# Patient Record
Sex: Male | Born: 1935 | Race: White | Hispanic: No | Marital: Married | Smoking: Current some day smoker
Health system: Southern US, Community
[De-identification: ages and names within clinical notes are randomized; demographics above are authoritative.]

## PROBLEM LIST (undated history)

## (undated) DIAGNOSIS — E785 Hyperlipidemia, unspecified: Secondary | ICD-10-CM

## (undated) DIAGNOSIS — C801 Malignant (primary) neoplasm, unspecified: Secondary | ICD-10-CM

## (undated) DIAGNOSIS — I2510 Atherosclerotic heart disease of native coronary artery without angina pectoris: Secondary | ICD-10-CM

## (undated) DIAGNOSIS — I739 Peripheral vascular disease, unspecified: Secondary | ICD-10-CM

## (undated) DIAGNOSIS — I10 Essential (primary) hypertension: Secondary | ICD-10-CM

## (undated) DIAGNOSIS — M359 Systemic involvement of connective tissue, unspecified: Secondary | ICD-10-CM

## (undated) DIAGNOSIS — I6529 Occlusion and stenosis of unspecified carotid artery: Secondary | ICD-10-CM

## (undated) DIAGNOSIS — J449 Chronic obstructive pulmonary disease, unspecified: Secondary | ICD-10-CM

## (undated) DIAGNOSIS — I639 Cerebral infarction, unspecified: Secondary | ICD-10-CM

## (undated) DIAGNOSIS — F0390 Unspecified dementia without behavioral disturbance: Secondary | ICD-10-CM

## (undated) HISTORY — PX: SKIN BIOPSY: SHX1

## (undated) HISTORY — PX: CORONARY ARTERY BYPASS GRAFT: SHX141

## (undated) HISTORY — PX: LUNG BIOPSY: SHX232

## (undated) HISTORY — PX: APPENDECTOMY: SHX54

---

## 2008-11-08 ENCOUNTER — Ambulatory Visit: Payer: Self-pay | Admitting: Orthopedic Surgery

## 2008-11-13 ENCOUNTER — Inpatient Hospital Stay: Payer: Self-pay | Admitting: Internal Medicine

## 2008-12-21 ENCOUNTER — Ambulatory Visit: Payer: Self-pay | Admitting: Urology

## 2009-10-17 ENCOUNTER — Ambulatory Visit: Payer: Self-pay | Admitting: Internal Medicine

## 2010-12-12 ENCOUNTER — Ambulatory Visit: Payer: Self-pay | Admitting: Internal Medicine

## 2011-04-24 ENCOUNTER — Ambulatory Visit: Payer: Self-pay

## 2011-12-11 ENCOUNTER — Ambulatory Visit: Payer: Self-pay | Admitting: Family Medicine

## 2011-12-12 ENCOUNTER — Inpatient Hospital Stay: Payer: Self-pay | Admitting: Internal Medicine

## 2011-12-12 LAB — CBC
HCT: 38.6 % — ABNORMAL LOW (ref 40.0–52.0)
HGB: 12.8 g/dL — ABNORMAL LOW (ref 13.0–18.0)
MCH: 32.5 pg (ref 26.0–34.0)
MCHC: 33.1 g/dL (ref 32.0–36.0)
RDW: 14.3 % (ref 11.5–14.5)

## 2011-12-12 LAB — URINALYSIS, COMPLETE
Bacteria: NONE SEEN
Bilirubin,UR: NEGATIVE
Glucose,UR: NEGATIVE mg/dL (ref 0–75)
Ketone: NEGATIVE
Nitrite: NEGATIVE
Ph: 6 (ref 4.5–8.0)
Protein: NEGATIVE
WBC UR: <1 /HPF (ref 0–5)

## 2011-12-12 LAB — COMPREHENSIVE METABOLIC PANEL
Albumin: 2.7 g/dL — ABNORMAL LOW (ref 3.4–5.0)
Alkaline Phosphatase: 103 U/L (ref 50–136)
Calcium, Total: 8.4 mg/dL — ABNORMAL LOW (ref 8.5–10.1)
Chloride: 101 mmol/L (ref 98–107)
Creatinine: 1.1 mg/dL (ref 0.60–1.30)
EGFR (Non-African Amer.): >60
Glucose: 90 mg/dL (ref 65–99)
Potassium: 4.3 mmol/L (ref 3.5–5.1)
Sodium: 137 mmol/L (ref 136–145)
Total Protein: 6.6 g/dL (ref 6.4–8.2)

## 2011-12-13 LAB — CBC WITH DIFFERENTIAL/PLATELET
Basophil #: 0 10*3/uL (ref 0.0–0.1)
Basophil %: 0.3 %
Eosinophil #: 0 10*3/uL (ref 0.0–0.7)
Eosinophil %: 0.4 %
HCT: 36.6 % — ABNORMAL LOW (ref 40.0–52.0)
MCH: 32.7 pg (ref 26.0–34.0)
MCHC: 33.2 g/dL (ref 32.0–36.0)
MCV: 98 fL (ref 80–100)
Monocyte #: 0.6 x10 3/mm (ref 0.2–1.0)
Neutrophil #: 10.6 10*3/uL — ABNORMAL HIGH (ref 1.4–6.5)
Neutrophil %: 84.4 %
RBC: 3.72 10*6/uL — ABNORMAL LOW (ref 4.40–5.90)
WBC: 12.5 10*3/uL — ABNORMAL HIGH (ref 3.8–10.6)

## 2011-12-13 LAB — BASIC METABOLIC PANEL
Calcium, Total: 8.2 mg/dL — ABNORMAL LOW (ref 8.5–10.1)
Chloride: 105 mmol/L (ref 98–107)
Creatinine: 0.99 mg/dL (ref 0.60–1.30)
EGFR (African American): >60
EGFR (Non-African Amer.): >60
Potassium: 4.2 mmol/L (ref 3.5–5.1)

## 2011-12-14 LAB — BASIC METABOLIC PANEL
Anion Gap: 10 (ref 7–16)
Calcium, Total: 8.3 mg/dL — ABNORMAL LOW (ref 8.5–10.1)
Creatinine: 0.93 mg/dL (ref 0.60–1.30)
EGFR (Non-African Amer.): >60
Osmolality: 281 (ref 275–301)
Potassium: 4.2 mmol/L (ref 3.5–5.1)
Sodium: 138 mmol/L (ref 136–145)

## 2011-12-14 LAB — CBC WITH DIFFERENTIAL/PLATELET
Basophil %: 0.1 %
Eosinophil #: 0 10*3/uL (ref 0.0–0.7)
Eosinophil %: 0.1 %
HCT: 35.7 % — ABNORMAL LOW (ref 40.0–52.0)
Lymphocyte %: 3.1 %
MCH: 32.3 pg (ref 26.0–34.0)
MCHC: 33.2 g/dL (ref 32.0–36.0)
MCV: 97 fL (ref 80–100)
Monocyte #: 0.1 x10 3/mm — ABNORMAL LOW (ref 0.2–1.0)
Neutrophil %: 95.8 %
RDW: 14.3 % (ref 11.5–14.5)
WBC: 11.9 10*3/uL — ABNORMAL HIGH (ref 3.8–10.6)

## 2011-12-16 LAB — CBC WITH DIFFERENTIAL/PLATELET
Basophil #: 0 10*3/uL (ref 0.0–0.1)
Basophil %: 0 %
HCT: 36.3 % — ABNORMAL LOW (ref 40.0–52.0)
HGB: 12.1 g/dL — ABNORMAL LOW (ref 13.0–18.0)
Lymphocyte %: 8.4 %
Neutrophil #: 11.5 10*3/uL — ABNORMAL HIGH (ref 1.4–6.5)
Platelet: 174 10*3/uL (ref 150–440)
RDW: 14.4 % (ref 11.5–14.5)
WBC: 13.3 10*3/uL — ABNORMAL HIGH (ref 3.8–10.6)

## 2011-12-16 LAB — BASIC METABOLIC PANEL
BUN: 25 mg/dL — ABNORMAL HIGH (ref 7–18)
Calcium, Total: 8.1 mg/dL — ABNORMAL LOW (ref 8.5–10.1)
Chloride: 104 mmol/L (ref 98–107)
Creatinine: 1.08 mg/dL (ref 0.60–1.30)
EGFR (African American): >60
EGFR (Non-African Amer.): >60
Glucose: 74 mg/dL (ref 65–99)
Osmolality: 279 (ref 275–301)
Sodium: 138 mmol/L (ref 136–145)

## 2011-12-18 LAB — CULTURE, BLOOD (SINGLE)

## 2012-01-04 ENCOUNTER — Ambulatory Visit: Payer: Self-pay | Admitting: Internal Medicine

## 2012-01-10 ENCOUNTER — Ambulatory Visit: Payer: Self-pay | Admitting: Internal Medicine

## 2012-05-20 ENCOUNTER — Ambulatory Visit: Payer: Self-pay | Admitting: Neurology

## 2012-06-30 ENCOUNTER — Ambulatory Visit: Payer: Self-pay | Admitting: Neurology

## 2013-04-28 ENCOUNTER — Emergency Department: Payer: Self-pay | Admitting: Emergency Medicine

## 2013-04-28 LAB — CBC
MCHC: 36 g/dL (ref 32.0–36.0)
Platelet: 164 10*3/uL (ref 150–440)
RBC: 3.81 10*6/uL — ABNORMAL LOW (ref 4.40–5.90)

## 2013-04-28 LAB — PROTIME-INR: Prothrombin Time: 14.1 secs (ref 11.5–14.7)

## 2013-09-09 ENCOUNTER — Ambulatory Visit: Payer: Self-pay | Admitting: Unknown Physician Specialty

## 2013-09-24 ENCOUNTER — Inpatient Hospital Stay: Payer: Self-pay | Admitting: Internal Medicine

## 2013-09-24 LAB — CBC
HCT: 41.8 % (ref 40.0–52.0)
HGB: 14.3 g/dL (ref 13.0–18.0)
MCH: 33.8 pg (ref 26.0–34.0)
MCHC: 34.3 g/dL (ref 32.0–36.0)
MCV: 99 fL (ref 80–100)
Platelet: 117 10*3/uL — ABNORMAL LOW (ref 150–440)
RBC: 4.24 10*6/uL — ABNORMAL LOW (ref 4.40–5.90)
RDW: 14.3 % (ref 11.5–14.5)
WBC: 21.1 10*3/uL — AB (ref 3.8–10.6)

## 2013-09-24 LAB — URINALYSIS, COMPLETE
Bacteria: NONE SEEN
Bilirubin,UR: NEGATIVE
GLUCOSE, UR: NEGATIVE mg/dL (ref 0–75)
Hyaline Cast: 3
Ketone: NEGATIVE
LEUKOCYTE ESTERASE: NEGATIVE
NITRITE: NEGATIVE
PROTEIN: NEGATIVE
Ph: 5 (ref 4.5–8.0)
RBC,UR: 1 /HPF (ref 0–5)
SQUAMOUS EPITHELIAL: NONE SEEN
Specific Gravity: 1.019 (ref 1.003–1.030)
WBC UR: 1 /HPF (ref 0–5)

## 2013-09-24 LAB — COMPREHENSIVE METABOLIC PANEL
ALBUMIN: 2.7 g/dL — AB (ref 3.4–5.0)
ALT: 22 U/L (ref 12–78)
AST: 24 U/L (ref 15–37)
Alkaline Phosphatase: 58 U/L
Anion Gap: 6 — ABNORMAL LOW (ref 7–16)
BUN: 18 mg/dL (ref 7–18)
Bilirubin,Total: 1 mg/dL (ref 0.2–1.0)
CHLORIDE: 107 mmol/L (ref 98–107)
Calcium, Total: 7.1 mg/dL — ABNORMAL LOW (ref 8.5–10.1)
Co2: 23 mmol/L (ref 21–32)
Creatinine: 1.04 mg/dL (ref 0.60–1.30)
EGFR (African American): >60
Glucose: 87 mg/dL (ref 65–99)
OSMOLALITY: 273 (ref 275–301)
Potassium: 3.7 mmol/L (ref 3.5–5.1)
SODIUM: 136 mmol/L (ref 136–145)
Total Protein: 5.4 g/dL — ABNORMAL LOW (ref 6.4–8.2)

## 2013-09-24 LAB — TROPONIN I
Troponin-I: <0.02 ng/mL
Troponin-I: <0.02 ng/mL

## 2013-09-25 LAB — CBC WITH DIFFERENTIAL/PLATELET
BASOS PCT: 0 %
Basophil #: 0 10*3/uL (ref 0.0–0.1)
EOS ABS: 0 10*3/uL (ref 0.0–0.7)
Eosinophil %: 0 %
HCT: 36.7 % — AB (ref 40.0–52.0)
HGB: 12.6 g/dL — AB (ref 13.0–18.0)
LYMPHS PCT: 2.9 %
Lymphocyte #: 0.4 10*3/uL — ABNORMAL LOW (ref 1.0–3.6)
MCH: 33.7 pg (ref 26.0–34.0)
MCHC: 34.2 g/dL (ref 32.0–36.0)
MCV: 99 fL (ref 80–100)
MONO ABS: 0.2 x10 3/mm (ref 0.2–1.0)
Monocyte %: 1.7 %
NEUTROS ABS: 13.3 10*3/uL — AB (ref 1.4–6.5)
Neutrophil %: 95.4 %
Platelet: 95 10*3/uL — ABNORMAL LOW (ref 150–440)
RBC: 3.72 10*6/uL — AB (ref 4.40–5.90)
RDW: 14.4 % (ref 11.5–14.5)
WBC: 14 10*3/uL — ABNORMAL HIGH (ref 3.8–10.6)

## 2013-09-25 LAB — BASIC METABOLIC PANEL
Anion Gap: 8 (ref 7–16)
BUN: 19 mg/dL — ABNORMAL HIGH (ref 7–18)
CALCIUM: 8.4 mg/dL — AB (ref 8.5–10.1)
CO2: 25 mmol/L (ref 21–32)
Chloride: 101 mmol/L (ref 98–107)
Creatinine: 1.19 mg/dL (ref 0.60–1.30)
EGFR (African American): >60
GFR CALC NON AF AMER: 57 — AB
Glucose: 135 mg/dL — ABNORMAL HIGH (ref 65–99)
Osmolality: 273 (ref 275–301)
Potassium: 4.1 mmol/L (ref 3.5–5.1)
Sodium: 134 mmol/L — ABNORMAL LOW (ref 136–145)

## 2013-09-29 LAB — CULTURE, BLOOD (SINGLE)

## 2014-10-16 NOTE — H&P (Signed)
PATIENT NAME:  Charles, Gallagher MR#:  [id number] DATE OF BIRTH:  1929/01/25  DATE OF ADMISSION:  09/24/2013  PRIMARY CARE PHYSICIAN: Emily Filbert, MD.  REFERRING EMERGENCY ROOM PHYSICIAN:  Dr. Lavonia Drafts.   CHIEF COMPLAINT: Shortness of breath.   HISTORY OF PRESENT ILLNESS: An 81 year old male with past medical history of coronary artery disease, COPD, active smoker who lives a reasonably active life, but has chronic spinal stenosis and some back pain. He is able to walk with a walker with some imbalance issues and has been falling down on and off. For the last 1 to 2 weeks, he is feeling short of breath. He went to primary care physician, was given prednisone and antibiotics, but it helped a little bit, but not much. And so because of this added shortness of breath on his imbalance and back pain issue, he had also fallen down. Finally, family decided to bring him to the hospital. In the Emergency Room as per the ER physician, on room air on resting, his oxygen saturation was 89. But when he made him stand up and walk, saturation dropped to 85 and so he is given an admission. He received nebulizer treatment and steroid in the Emergency Room, but not much help. Denies any fever, cough or sputum.  REVIEW OF SYSTEMS:  CONSTITUTIONAL: Negative for fever, fatigue, weakness, pain or weight loss.  EYES: No blurring, double vision, discharge or redness.  EARS, NOSE, THROAT: No tinnitus, ear pain or hearing loss.  RESPIRATORY: No cough, wheezing, hemoptysis or shortness of breath.  CARDIOVASCULAR: No chest pain, orthopnea, edema, arrhythmia or palpitations.  GASTROINTESTINAL: No nausea, vomiting, diarrhea, abdominal pain.  GENITOURINARY: No dysuria, hematuria or increased frequency.  ENDOCRINE: No heat or cold intolerance.  SKIN: No rashes. LEGS: No edema.  NEUROLOGICAL: No numbness, weakness, tremor or vertigo, but has somewhat imbalance while walking because of his back pain.  PSYCHIATRIC: No  anxiety, insomnia, bipolar disorder.   PAST MEDICAL HISTORY: 1.  History of coronary artery disease and CABG 20 years ago.  2.  Dementia.  3.  Spinal stenosis causing difficulty in walking and somewhat pain in the lower back.  4.  Coronary arteries are blocked up to 70% and he is following up with Dr. Manuella Ghazi in clinic for that frequently. 5.  COPD, current smoker.   PAST SURGICAL HISTORY: 1.  CABG.  2.  Lung biopsy.  3.  Appendectomy.   FAMILY HISTORY: Father died of gangrene. Mother died of cerebrovascular accident.   SOCIAL HISTORY: He lives with family. He walks with a cane. He is a smoker, 1 pack at least every day since teenage. Denies drinking alcohol or doing any drugs. Not on home oxygen yet.  MEDICATIONS: At home: 1.  Vitamin B complex 100 mg oral once a day.  2.  Simvastatin 80 mg once a day.  3.  Sertraline 50 mg oral once a day.  4.  Plavix 75 mg once a day. 5.  Galantamine 8 mg oral tablet 2 times a day.  6.  Cefuroxime 250 mg oral tablet 2 times a day.  7.  Aspirin 81 mg once a day.  PHYSICAL EXAMINATION: VITAL SIGNS:  In ER, temperature 98.6, pulse 67, respirations 28 on presentation, currently 18. Blood pressure 191/43 on presentation, currently it is 113/60 and oxygen saturation is in 90s at room air.  GENERAL: The patient is fully alert and oriented to time, place and person. Has some hearing issues.  HEENT: Head and neck atraumatic. Conjunctivae  pink. Oral mucosa moist.  NECK: Supple. No JVD.  RESPIRATORY: Bilateral equal air entry, somewhat wheezing present.  CARDIOVASCULAR: S1, S2 present, regular. No murmur.  ABDOMEN: Soft, nontender. Bowel sounds present.  No organomegaly.  SKIN: No rashes. LEGS:  No edema.  NEUROLOGICAL: Power 5/5. Moves all 4 limbs. Follows commands. JOINTS: No swelling or tenderness.  PSYCHIATRIC: Does not appear any acute psychiatric illness at this time.  IMPORTANT LABORATORY RESULTS: Chest x-ray, PA and lateral: No acute process.  Previous CABG, chronic pulmonary fibrosis.  Glucose 87, creatinine 1.04. Sodium 136, potassium 3.7, chloride 107, CO2 of 23. Calcium is 7.1. Total protein 5.4. Albumin is 2.7, bilirubin 1, alkaline phosphate 58, SGOT 24 and SGPT 22. Troponin less than 0.02. WBC 21.1, hemoglobin 14.3, platelet count 117.   ASSESSMENT AND PLAN: An 81 year old male with past medical history of coronary artery disease, smoker and chronic obstructive pulmonary disease came to the Emergency Room with worsening shortness of breath and found having hypoxia on room air.  1.  Chronic obstructive pulmonary disease exacerbation and acute respiratory failure. The patient is a chronic smoker and has some underlying lung fibrosis.  oral steroid and antibiotics by primary care physician, still did not help. And as per Emergency Room physician, his oxygen saturation is dropping up to 85 on attempt to walk. We will give him IV steroid. We will start him on Spiriva and DuoNeb, and we will also give antibiotic. His white cell count is high. He may need to follow up with Dr. Raul Del in office after discharge.  2.  Generalized weakness, chronic back pain and falls. The patient has spinal stenosis which was causing him chronic back pain and somewhat imbalance but now more weakness. He has been falling down, so we will call physical therapy consult for further management of this issue.  3.  History of coronary artery disease status post coronary artery bypass graft. We will continue his aspirin and statins.  4.  Carotid artery atherosclerosis. He has blockages up to 70%. As per him, he is on aspirin and statin. We will continue same.  5.  Smoking. He is an active smoker. Cessation counseling is done for 4 minutes and offered a nicotine patch or inhalers. Currently, he does not want that.  CODE STATUS: Full code.  TOTAL TIME SPENT ON THIS ADMISSION:  50 minutes.    ____________________________ Ceasar Lund Anselm Jungling,  MD vgv:ce D: 09/24/2013 14:24:59 ET T: 09/24/2013 15:17:39 ET JOB#: [id number]  cc: Ceasar Lund. Anselm Jungling, MD, <Dictator> [name], MD [facility] MD ELECTRONICALLY SIGNED 09/28/2013 22:00

## 2014-10-16 NOTE — Discharge Summary (Signed)
PATIENT NAME:  Charles Gallagher, Charles Gallagher MR#:  [id number] DATE OF BIRTH:  01/07/1929  DATE OF ADMISSION:  09/24/2013 DATE OF DISCHARGE:  09/26/2013   DISCHARGE DIAGNOSES:  1. Right lower lobe aspiration pneumonia.  2. Lumbar spinal stenosis with weakness and falls.  3. Dementia, mild to moderate.  4. Coronary artery disease.  5. Chronic obstructive pulmonary disease.   DISCHARGE MEDICATIONS:  1. Simvastatin 80 mg at bedtime.  2. Augmentin 500 mg b.i.d. for 1 week.  3. Azithromycin 250 mg daily for 5 days.  4. Sertraline 50 mg daily. 5. Plavix 75 mg daily.  6. Aspirin 81 mg daily.  7. Galantamine 8 mg b.i.d. 8. Prednisone 20 mg daily for 10 days.  9. Tessalon Perles 100 mg t.i.d. p.r.n. cough.   REASON FOR ADMISSION: An 81 year old male presents with falls, coughing after eating and dyspnea with fever. Please see H and P for HPI, past medical history and physical exam.   HOSPITAL COURSE: The patient was admitted, was hypoxic, clinical exam consistent with aspiration pneumonia in the right lower lung fields. MBSS did show aspiration of thins and some solids. The family was instructed as far as aspiration issues. He saw physical therapy, and with his lower extremity weakness, will be getting home health physical therapy. His antibiotic was switched to p.o. Augmentin, which will cover anaerobes and aspiration better than the Ceftin did. He will be on a prednisone taper. He will follow up with Dr. Sabra Heck on Tuesday. Overall prognosis is guarded.   ____________________________ [name], MD mfm:lb D: 09/26/2013 10:34:08 ET T: 09/26/2013 11:47:12 ET JOB#: [id number]  cc: [name], MD, <Dictator> MARK Roselee Culver MD ELECTRONICALLY SIGNED 09/27/2013 9:56

## 2014-10-17 NOTE — Discharge Summary (Signed)
PATIENT NAME:  Charles Gallagher, Charles Gallagher MR#:  [id number] DATE OF BIRTH:  April 16, 1929  DATE OF ADMISSION:  12/12/2011 DATE OF DISCHARGE:  12/18/2011   DISCHARGE DIAGNOSES:  1. Left lower lobe pneumonia.  2. Arteriosclerotic cardiovascular disease.  3. Hyperlipidemia.  4. Oral candidiasis.  DISCHARGE MEDICATIONS:  1. Aspirin 81 mg daily.  2. Simvastatin 80 mg at bedtime.  3. Plavix 75 mg daily.  4. Prednisone taper.  5. Albuterol inhaler 1 puff q.i.d.  6. Ciprofloxacin 250 mg b.i.d. for one week.    REASON FOR ADMISSION: The patient is an 81 year old male who presented with left lower lobe pneumonia and hypoxemia. Please see history and physical for history of present illness, past medical history, and physical exam.   HOSPITAL COURSE: The patient was admitted and treated with IV Rocephin and azithromycin. His candidiasis was treated with nystatin. He was given high dose IV steroids. His lungs improved dramatically over the hospital course. Chest x-ray showed improvement. Pulse oximetry off oxygen was 93 to 99%. He was up and around more stable, eating, doing well, almost back to baseline. I will follow-up with him in one week.   OVERALL PROGNOSIS: Good.   ____________________________ [name], MD mfm:drc D: 12/18/2011 07:32:55 ET T: 12/18/2011 07:54:21 ET JOB#: [id number]  cc: [name], MD, <Dictator> Damione Robideau Roselee Culver MD ELECTRONICALLY SIGNED 12/22/2011 9:18

## 2014-10-17 NOTE — H&P (Signed)
PATIENT NAME:  Charles Gallagher, Charles Gallagher MR#:  [id number] DATE OF BIRTH:  07/03/1928  DATE OF ADMISSION:  12/12/2011  PRIMARY CARE PHYSICIAN: Dr. Sabra Heck   REFERRING PHYSICIAN: Dr. Roxine Caddy   CHIEF COMPLAINT: Dizziness.   HISTORY OF PRESENT ILLNESS: The patient is an 81 year old Caucasian male with a history of COPD, CAD, TIA, and right carotid stenosis who presented to the ED with dizziness and possible syncope. Apparently the patient was diagnosed with pneumonia one week ago. He has been treated with Levaquin for one week but the patient still has a cough with sputum. He feels dizzy sometimes. This morning his wife heard some noise and found he was on the floor but she said the patient was awake, alert, didn't notice if he had some syncope or loss of consciousness. The patient could not remember what happened. He only complains of dizziness but denies any seizure, syncope, loss of consciousness, or incontinence. According to the patient's daughter, the patient has been treated with Levaquin but he took Levaquin 1000 mg by mistake for about three days. In addition, the patient is still smoking 1 pack a day. The patient denies any fever or chills. No headache. No chest pain, palpitation, orthopnea, or nocturnal dyspnea. No abdominal pain, nausea, vomiting, or diarrhea. No dysuria, hematuria. The patient was noted to have orthostatic hypotension. Blood pressure in lying position was 106/57 and decreased to 72/45 in standing position and now I ordered normal saline bolus.   SOCIAL HISTORY: The patient has been smoking 1 pack a day since teenager. Denies any alcohol drinking or illicit drugs.   PAST SURGICAL HISTORY:  1. CABG. 2. Lung biopsy.  3. Appendectomy.   FAMILY HISTORY: Father died of gangrene. Mother died of CVA.   ALLERGIES: None.   MEDICATIONS:  1. Aspirin 81 mg p.o. daily.  2. Levaquin 500 mg p.o. daily.  3. Plavix 75 mg p.o. daily.  4. Prednisone 10 mg p.o. daily. Last dose is today.    5. Zocor 80 mg p.o. at bedtime.  6. Vitamin B Complex 100 1 tablet p.o. daily.   REVIEW OF SYSTEMS: CONSTITUTIONAL: The patient denies any fever or chills. No headache but has dizziness and weakness. EYES: No double vision or blurred vision. ENT: No epistaxis, postnasal drip, dysphagia, or slurred speech. CARDIOVASCULAR: No chest pain, palpitation, orthopnea, or nocturnal dyspnea. No leg edema. PULMONARY: Positive for cough with sputum. Has mild shortness of breath sometimes but denies hemoptysis or wheezing. GI: No abdominal pain, nausea, vomiting, or diarrhea. No melena or bloody stool. GU: No dysuria, hematuria, or incontinence. SKIN: No rash or jaundice. HEMATOLOGY: No easy bruising, bleeding. SKIN: No rash or jaundice. NEUROLOGY: No syncope, loss of consciousness or seizure.   PHYSICAL EXAMINATION:   VITALS: Temperature 98, blood pressure 100/57. As mentioned above, blood pressure decreased to 72/45 on standing position. Oxygen saturation 93% on room air.   GENERAL: The patient is alert, awake, oriented in no acute distress.   HEENT: Pupils are round, equal, reactive to light and accommodation. Mild dry oral mucosa. Clear oropharynx.   NECK: Supple. No JVD or carotid bruits. No adenopathy. No thyromegaly.   CARDIOVASCULAR: S1, S2 regular rate and rhythm. No murmurs or gallops.   PULMONARY: Bilateral air entry. No wheezing but has mild crackles in bilateral base.   ABDOMEN: Soft. No distention. No tenderness. No organomegaly. Bowel sounds present.   EXTREMITIES: No edema, clubbing, or cyanosis. No calf tenderness. Strong bilateral pedal pulses.   SKIN: No rash or jaundice.  NEUROLOGY: The patient is alert, awake, oriented in no acute distress. No focal deficit. Power 5/5. Sensation intact. Deep tendon reflexes 2+.   LABORATORY DATA: ABG shows pH 7.44, pCO2 37, pO2 55 with FiO2 21%.   CAT scan of head no acute intracranial process.   Chest x-ray showed improvement in previously  noted left lower lobe infiltrate. No new infiltrates.   WBC 12.6, hemoglobin 12.8, platelets 226, glucose 90, BUN 26, creatinine 1.1. Electrolytes are normal. Troponin less than 0.02.   EKG showed normal sinus rhythm with right bundle branch block at 63 beats per minute.   IMPRESSION:  1. Dizziness.  2. Orthostatic hypotension.  3. Pneumonia.  4. Hypoxia.  5. Leukocytosis.  6. Dehydration.  7. Coronary artery disease.  8. Chronic obstructive pulmonary disease.  9. Tobacco use disorder.   PLAN OF TREATMENT:  1. The patient will be admitted to tele floor.  2. Continue O2 by nasal cannula. 3. Discontinue Levaquin. 4. Start Zithromax and Rocephin.  5. Will give DuoNeb. 6. Follow-up CBC.  7. For dizziness and orthostatic hypotension and dehydration, we will give normal saline bolus 500 mL and then change to 100 mL/h if blood pressure is stable. The patient needs closely monitored vital signs and follow-up BMP.  8. For coronary artery disease, will continue aspirin, Plavix, and statin.  9. We will get physical therapy evaluation.  10. For tobacco use, I counseled the patient for smoking cessation and we will give nicotine patch.  Discussed the patient's situation and plan of treatment with the patient, the patient's wife, daughter and son.   TIME SPENT: About 65 minutes.   ____________________________ Demetrios Loll, MD qc:drc D: 12/12/2011 11:20:12 ET T: 12/12/2011 11:32:25 ET JOB#: [id number]  cc: Demetrios Loll, MD, <Dictator> Demetrios Loll MD ELECTRONICALLY SIGNED 12/12/2011 19:24

## 2014-11-07 ENCOUNTER — Other Ambulatory Visit: Payer: Self-pay

## 2014-11-07 ENCOUNTER — Emergency Department
Admission: EM | Admit: 2014-11-07 | Discharge: 2014-11-07 | Disposition: A | Discharge status: Home / Self Care | Payer: Medicare Other | Attending: Emergency Medicine | Admitting: Emergency Medicine

## 2014-11-07 ENCOUNTER — Emergency Department: Payer: Medicare Other

## 2014-11-07 ENCOUNTER — Encounter: Payer: Self-pay | Admitting: Emergency Medicine

## 2014-11-07 DIAGNOSIS — Z79899 Other long term (current) drug therapy: Secondary | ICD-10-CM | POA: Diagnosis not present

## 2014-11-07 DIAGNOSIS — Z7902 Long term (current) use of antithrombotics/antiplatelets: Secondary | ICD-10-CM | POA: Insufficient documentation

## 2014-11-07 DIAGNOSIS — R1032 Left lower quadrant pain: Secondary | ICD-10-CM

## 2014-11-07 DIAGNOSIS — Z720 Tobacco use: Secondary | ICD-10-CM | POA: Diagnosis not present

## 2014-11-07 DIAGNOSIS — N200 Calculus of kidney: Secondary | ICD-10-CM | POA: Diagnosis not present

## 2014-11-07 DIAGNOSIS — R52 Pain, unspecified: Secondary | ICD-10-CM

## 2014-11-07 HISTORY — DX: Unspecified dementia, unspecified severity, without behavioral disturbance, psychotic disturbance, mood disturbance, and anxiety: F03.90

## 2014-11-07 HISTORY — DX: Chronic obstructive pulmonary disease, unspecified: J44.9

## 2014-11-07 HISTORY — DX: Systemic involvement of connective tissue, unspecified: M35.9

## 2014-11-07 HISTORY — DX: Malignant (primary) neoplasm, unspecified: C80.1

## 2014-11-07 HISTORY — DX: Cerebral infarction, unspecified: I63.9

## 2014-11-07 LAB — COMPREHENSIVE METABOLIC PANEL
ALT: 30 U/L (ref 17–63)
AST: 41 U/L (ref 15–41)
Albumin: 4.6 g/dL (ref 3.5–5.0)
Alkaline Phosphatase: 63 U/L (ref 38–126)
Anion gap: 11 (ref 5–15)
BILIRUBIN TOTAL: 0.8 mg/dL (ref 0.3–1.2)
BUN: 20 mg/dL (ref 6–20)
CALCIUM: 9.5 mg/dL (ref 8.9–10.3)
CO2: 25 mmol/L (ref 22–32)
Chloride: 104 mmol/L (ref 101–111)
Creatinine, Ser: 1.29 mg/dL — ABNORMAL HIGH (ref 0.61–1.24)
GFR calc Af Amer: 57 mL/min — ABNORMAL LOW (ref >60)
GFR, EST NON AFRICAN AMERICAN: 50 mL/min — AB (ref >60)
GLUCOSE: 132 mg/dL — AB (ref 65–99)
Potassium: 4.8 mmol/L (ref 3.5–5.1)
Sodium: 140 mmol/L (ref 135–145)
TOTAL PROTEIN: 7.5 g/dL (ref 6.5–8.1)

## 2014-11-07 LAB — URINALYSIS COMPLETE WITH MICROSCOPIC (ARMC ONLY)
BILIRUBIN URINE: NEGATIVE
Bacteria, UA: NONE SEEN
Glucose, UA: NEGATIVE mg/dL
Ketones, ur: NEGATIVE mg/dL
Leukocytes, UA: NEGATIVE
Nitrite: NEGATIVE
Protein, ur: 30 mg/dL — AB
SPECIFIC GRAVITY, URINE: 1.018 (ref 1.005–1.030)
pH: 5 (ref 5.0–8.0)

## 2014-11-07 LAB — CBC WITH DIFFERENTIAL/PLATELET
BASOS ABS: 0.1 10*3/uL (ref 0–0.1)
Basophils Relative: 0 %
EOS PCT: 0 %
Eosinophils Absolute: 0 10*3/uL (ref 0–0.7)
HEMATOCRIT: 39.9 % — AB (ref 40.0–52.0)
Hemoglobin: 13.8 g/dL (ref 13.0–18.0)
Lymphocytes Relative: 6 %
Lymphs Abs: 0.8 10*3/uL — ABNORMAL LOW (ref 1.0–3.6)
MCH: 33.5 pg (ref 26.0–34.0)
MCHC: 34.5 g/dL (ref 32.0–36.0)
MCV: 97.2 fL (ref 80.0–100.0)
MONO ABS: 0.9 10*3/uL (ref 0.2–1.0)
Monocytes Relative: 7 %
NEUTROS PCT: 87 %
Neutro Abs: 12.7 10*3/uL — ABNORMAL HIGH (ref 1.4–6.5)
Platelets: 191 10*3/uL (ref 150–440)
RBC: 4.11 MIL/uL — ABNORMAL LOW (ref 4.40–5.90)
RDW: 14.7 % — AB (ref 11.5–14.5)
WBC: 14.5 10*3/uL — AB (ref 3.8–10.6)

## 2014-11-07 LAB — LIPASE, BLOOD: LIPASE: 30 U/L (ref 22–51)

## 2014-11-07 MED ORDER — IOHEXOL 240 MG/ML SOLN
25.0000 mL | Freq: Once | INTRAMUSCULAR | Status: AC | PRN
  Administered 2014-11-07: 25 mL via ORAL

## 2014-11-07 MED ORDER — TAMSULOSIN HCL 0.4 MG PO CAPS: 0.4000 mg | ORAL_CAPSULE | Freq: Every day | ORAL | Status: DC

## 2014-11-07 MED ORDER — ONDANSETRON HCL 4 MG/2ML IJ SOLN
4.0000 mg | Freq: Once | INTRAMUSCULAR | Status: AC
  Administered 2014-11-07: 4 mg via INTRAVENOUS

## 2014-11-07 MED ORDER — IOHEXOL 300 MG/ML  SOLN
100.0000 mL | Freq: Once | INTRAMUSCULAR | Status: AC | PRN
  Administered 2014-11-07: 100 mL via INTRAVENOUS

## 2014-11-07 MED ORDER — ONDANSETRON 4 MG PO TBDP: 4.0000 mg | ORAL_TABLET | Freq: Four times a day (QID) | ORAL | Status: DC | PRN

## 2014-11-07 MED ORDER — ONDANSETRON HCL 4 MG/2ML IJ SOLN
INTRAMUSCULAR | Status: AC
  Administered 2014-11-07: 4 mg via INTRAVENOUS
  Filled 2014-11-07: qty 2

## 2014-11-07 NOTE — ED Notes (Signed)
Pt brought in by family with c/o LLQ abdominal pain , vomiting and SOB. Pt hx of SOB, always SOB per family, oxygen normally 90%. Pt denies pain at this time. Hx of dementia. Color WNL, no RR distress. No vomiting at this time. Family denies diarrhea.

## 2014-11-07 NOTE — ED Notes (Signed)
Pt reports LUQ abd pain that radiated to center of back that started yesterday. Family endorses vomiting. Denies diarrhea or fever. Family also endorses SOB and dizzyness, weakness decreased appetite

## 2014-11-07 NOTE — ED Provider Notes (Signed)
-----------------------------------------   5:50 PM on 11/07/2014 -----------------------------------------  Reviewed results and diagnosis of left-sided kidney stone with patient, his family, as well as urologist.  Reviewed history, labs, and CT with Dr.Eskridge. He also spoke directly with the patient's family. After discussing the option for a stent versus ongoing management with Flomax, Zofran and outpatient follow-up decision was made that patient will follow up with urology as an outpatient.  I think this is reasonable. The patient is tolerating by mouth well. He has no evidence of acute kidney injury or infection. Return precautions were strictly advised and family is understanding of the plan. They will set up follow-up with urology. Hopefully patient will pass this stone on his own, though given its size it may require intervention. No signs of complication.  Discharge to home. Of note the patient does have COPD, his baseline oxygen level per his family is about 90%. He has been satting well and denies any difficulty breathing. No wheezing clear lungs. I suspect this is his chronic appearance with known COPD.  [name], MD 11/07/14 [phone]

## 2014-11-07 NOTE — ED Notes (Signed)
Pt to CT

## 2014-11-07 NOTE — ED Notes (Signed)
Pt back from CT

## 2014-11-07 NOTE — Discharge Instructions (Signed)
Kidney Stones  Please schedule an appointment to follow up with [facility] urology as soon as possible. Please let them know that you're seen in the ER today so they can schedule follow-up with you this week.  Return to the ER right away if you have vomiting, feel dehydrated, cannot keep fluid down, had severe pain, or other new concerns arise. It is especially important that she come back right away if he develops signs of infection like chills, fever, or weakness.  In addition, your oxygen levels are fairly low. But it would appear that this is likely chronic. I highly recommend that she get close follow-up with her primary care physician as she may need to be on home oxygen in the future. Return to the ER right away if he developed shortness of breath, trouble breathing, feel weak, develop chest pain, or other new concerns arise.  Kidney stones (urolithiasis) are deposits that form inside your kidneys. The intense pain is caused by the stone moving through the urinary tract. When the stone moves, the ureter goes into spasm around the stone. The stone is usually passed in the urine.  CAUSES   A disorder that makes certain neck glands produce too much parathyroid hormone (primary hyperparathyroidism).  A buildup of uric acid crystals, similar to gout in your joints.  Narrowing (stricture) of the ureter.  A kidney obstruction present at birth (congenital obstruction).  Previous surgery on the kidney or ureters.  Numerous kidney infections. SYMPTOMS   Feeling sick to your stomach (nauseous).  Throwing up (vomiting).  Blood in the urine (hematuria).  Pain that usually spreads (radiates) to the groin.  Frequency or urgency of urination. DIAGNOSIS   Taking a history and physical exam.  Blood or urine tests.  CT scan.  Occasionally, an examination of the inside of the urinary bladder (cystoscopy) is performed. TREATMENT   Observation.  Increasing your fluid  intake.  Extracorporeal shock wave lithotripsy--This is a noninvasive procedure that uses shock waves to break up kidney stones.  Surgery may be needed if you have severe pain or persistent obstruction. There are various surgical procedures. Most of the procedures are performed with the use of small instruments. Only small incisions are needed to accommodate these instruments, so recovery time is minimized. The size, location, and chemical composition are all important variables that will determine the proper choice of action for you. Talk to your health care provider to better understand your situation so that you will minimize the risk of injury to yourself and your kidney.  HOME CARE INSTRUCTIONS   Drink enough water and fluids to keep your urine clear or pale yellow. This will help you to pass the stone or stone fragments.  Strain all urine through the provided strainer. Keep all particulate matter and stones for your health care provider to see. The stone causing the pain may be as small as a grain of salt. It is very important to use the strainer each and every time you pass your urine. The collection of your stone will allow your health care provider to analyze it and verify that a stone has actually passed. The stone analysis will often identify what you can do to reduce the incidence of recurrences.  Only take over-the-counter or prescription medicines for pain, discomfort, or fever as directed by your health care provider.  Make a follow-up appointment with your health care provider as directed.  Get follow-up X-rays if required. The absence of pain does not always mean that the  stone has passed. It may have only stopped moving. If the urine remains completely obstructed, it can cause loss of kidney function or even complete destruction of the kidney. It is your responsibility to make sure X-rays and follow-ups are completed. Ultrasounds of the kidney can show blockages and the status of  the kidney. Ultrasounds are not associated with any radiation and can be performed easily in a matter of minutes. SEEK MEDICAL CARE IF:  You experience pain that is progressive and unresponsive to any pain medicine you have been prescribed. SEEK IMMEDIATE MEDICAL CARE IF:   Pain cannot be controlled with the prescribed medicine.  You have a fever or shaking chills.  The severity or intensity of pain increases over 18 hours and is not relieved by pain medicine.  You develop a new onset of abdominal pain.  You feel faint or pass out.  You are unable to urinate. MAKE SURE YOU:   Understand these instructions.  Will watch your condition.  Will get help right away if you are not doing well or get worse. Document Released: 06/11/2005 Document Revised: 02/11/2013 Document Reviewed: 11/12/2012 [facility] Patient Information 2015 [place], [place]. This information is not intended to replace advice given to you by your health care provider. Make sure you discuss any questions you have with your health care provider.

## 2014-11-07 NOTE — ED Notes (Signed)
MD at bedside. 

## 2014-11-07 NOTE — ED Provider Notes (Signed)
[facility] Emergency Department Provider Note    ____________________________________________  Time seen: 3007  I have reviewed the triage vital signs and the nursing notes.   HISTORY  Chief Complaint Abdominal Pain   History limited by: Dementia, history obtained from family   HPI Charles Gallagher is a 81 y.o. male is brought in by family because of concerns for complaints of left lower quadrant pain. Patient family states this started yesterday and was worse this morning. It was also accompanied by vomiting this morning. They stated that he said it radiated into his back. He did not have any bloody stool. No obvious diarrhea. No fevers per she had by family. Has never had pain like this before.Currently the patient is not complaining of any pain.    Past Medical History  Diagnosis Date  . COPD (chronic obstructive pulmonary disease)   . Stroke   . Dementia   . Cancer     There are no active problems to display for this patient.   Past Surgical History  Procedure Laterality Date  . Coronary artery bypass graft    . Lung biopsy    . Appendectomy    . Skin biopsy      Current Outpatient Rx  Name  Route  Sig  Dispense  Refill  . aspirin EC 81 MG tablet   Oral   Take 81 mg by mouth daily.         Marland Kitchen b complex vitamins tablet   Oral   Take 1 tablet by mouth daily.         . clopidogrel (PLAVIX) 75 MG tablet   Oral   Take 75 mg by mouth daily.         Marland Kitchen galantamine (RAZADYNE) 8 MG tablet   Oral   Take 8 mg by mouth 2 (two) times daily.         . sertraline (ZOLOFT) 50 MG tablet   Oral   Take 50 mg by mouth daily.         . simvastatin (ZOCOR) 80 MG tablet   Oral   Take 80 mg by mouth daily.         . traMADol (ULTRAM) 50 MG tablet   Oral   Take by mouth every 6 (six) hours as needed.           Allergies Review of patient's allergies indicates no known allergies.  History reviewed. No pertinent family  history.  Social History History  Substance Use Topics  . Smoking status: Current Some Day Smoker -- 1.00 packs/day for 25 years    Types: Cigarettes  . Smokeless tobacco: Never Used  . Alcohol Use: No    Review of Systems  Unable to obtain secondary to dementia  ____________________________________________   PHYSICAL EXAM:  VITAL SIGNS: ED Triage Vitals  Enc Vitals Group     BP 11/07/14 1357 130/58 mmHg     Pulse Rate 11/07/14 1357 76     Resp 11/07/14 1357 22     Temp 11/07/14 1357 98 F (36.7 C)     Temp Source 11/07/14 1357 Oral     SpO2 11/07/14 1357 92 %     Weight 11/07/14 1357 160 lb (72.576 kg)     Height 11/07/14 1357 '5\' 8"'$  (1.727 m)     Head Cir --      Peak Flow --      Pain Score 11/07/14 1400 0   Constitutional: Alert and oriented.  Well appearing and in no distress. Eyes: Conjunctivae are normal. PERRL. Normal extraocular movements. ENT   Head: Normocephalic and atraumatic.   Nose: No congestion/rhinnorhea.   Mouth/Throat: Mucous membranes are moist.   Neck: No stridor. Hematological/Lymphatic/Immunilogical: No cervical lymphadenopathy. Cardiovascular: Normal rate, regular rhythm.  No murmurs, rubs, or gallops. Respiratory: Normal respiratory effort without tachypnea nor retractions. Breath sounds are clear and equal bilaterally. No wheezes/rales/rhonchi. Gastrointestinal: Soft minimally tender to palpation left lower quadrant. No rash. Soft, no guarding no rebound Genitourinary: Deferred Musculoskeletal: Normal range of motion in all extremities. No joint effusions.  No lower extremity tenderness nor edema. Neurologic:  Normal speech and language. Demented Skin:  Skin is warm, dry and intact. No rash noted.   ____________________________________________    LABS (pertinent positives/negatives)  Labs Reviewed  CBC WITH DIFFERENTIAL/PLATELET - Abnormal; Notable for the following:    WBC 14.5 (*)    RBC 4.11 (*)    HCT 39.9 (*)     RDW 14.7 (*)    Neutro Abs 12.7 (*)    Lymphs Abs 0.8 (*)    All other components within normal limits  COMPREHENSIVE METABOLIC PANEL - Abnormal; Notable for the following:    Glucose, Bld 132 (*)    Creatinine, Ser 1.29 (*)    GFR calc non Af Amer 50 (*)    GFR calc Af Amer 57 (*)    All other components within normal limits  URINALYSIS COMPLETEWITH MICROSCOPIC ([facility])  - Abnormal; Notable for the following:    Color, Urine AMBER (*)    APPearance HAZY (*)    Hgb urine dipstick 3+ (*)    Protein, ur 30 (*)    Squamous Epithelial / LPF 0-5 (*)    All other components within normal limits  LIPASE, BLOOD     ____________________________________________   EKG  EKG Time: 1413 Rate: 99 Rhythm: Normal sinus rhythm Axis: Normal Intervals: QTC 465 QRS: Right Bundle branch block ST changes: No ST elevation    ____________________________________________    RADIOLOGY  CT abdomen, pelvis pending at time of sign out  ____________________________________________   PROCEDURES  Procedure(s) performed: None  Critical Care performed: No  ____________________________________________   INITIAL IMPRESSION / ASSESSMENT AND PLAN / ED COURSE  Pertinent labs & imaging results that were available during my care of the patient were reviewed by me and considered in my medical decision making (see chart for details).  Patient here with some left lower quadrant pain. No fever. He is mildly tender in the left lower quadrant. Will check blood work and get a UA. Additionally will get a CT scan to evaluate for diverticulitis or any other concerning intra-abdominal pathology.  ____________________________________________   FINAL CLINICAL IMPRESSION(S) / ED DIAGNOSES  Final diagnoses:  Pain     Nance Pear, MD 11/08/14 1534

## 2014-11-07 NOTE — ED Notes (Signed)
Pt lying down, oxygen reading 84-86% on RA. Pt placed on 2L [place] while lying down; oxygen now above baseline of 90% on 2L- 93% on 2L [place]. Pt denies SOB. No RR distress, color WNL. Family at bedside, states pt baseline around 90% on RA.

## 2014-11-07 NOTE — ED Notes (Signed)
Pt drinking CT contrast at this time; family at bedside assisting patient.

## 2014-11-12 ENCOUNTER — Other Ambulatory Visit: Payer: Self-pay | Admitting: Urology

## 2014-11-12 ENCOUNTER — Ambulatory Visit
Admission: RE | Admit: 2014-11-12 | Discharge: 2014-11-12 | Disposition: A | Discharge status: Home / Self Care | Payer: Medicare Other | Source: Ambulatory Visit | Attending: Urology | Admitting: Urology

## 2014-11-12 DIAGNOSIS — N200 Calculus of kidney: Secondary | ICD-10-CM | POA: Diagnosis not present

## 2014-11-12 DIAGNOSIS — I878 Other specified disorders of veins: Secondary | ICD-10-CM | POA: Diagnosis not present

## 2014-11-12 DIAGNOSIS — N201 Calculus of ureter: Secondary | ICD-10-CM

## 2014-11-16 ENCOUNTER — Other Ambulatory Visit: Payer: Self-pay | Admitting: Urology

## 2014-11-16 DIAGNOSIS — N132 Hydronephrosis with renal and ureteral calculous obstruction: Secondary | ICD-10-CM

## 2014-11-17 ENCOUNTER — Ambulatory Visit
Admission: RE | Admit: 2014-11-17 | Discharge: 2014-11-17 | Disposition: A | Discharge status: Home / Self Care | Payer: Medicare Other | Source: Ambulatory Visit | Attending: Urology | Admitting: Urology

## 2014-11-17 DIAGNOSIS — N200 Calculus of kidney: Secondary | ICD-10-CM | POA: Insufficient documentation

## 2014-11-17 DIAGNOSIS — N132 Hydronephrosis with renal and ureteral calculous obstruction: Secondary | ICD-10-CM

## 2016-02-27 ENCOUNTER — Emergency Department: Payer: Medicare Other

## 2016-02-27 ENCOUNTER — Emergency Department
Admission: EM | Admit: 2016-02-27 | Discharge: 2016-02-27 | Disposition: A | Discharge status: Home / Self Care | Payer: Medicare Other | Attending: Emergency Medicine | Admitting: Emergency Medicine

## 2016-02-27 DIAGNOSIS — E876 Hypokalemia: Secondary | ICD-10-CM | POA: Diagnosis not present

## 2016-02-27 DIAGNOSIS — Z7982 Long term (current) use of aspirin: Secondary | ICD-10-CM | POA: Insufficient documentation

## 2016-02-27 DIAGNOSIS — S0990XA Unspecified injury of head, initial encounter: Secondary | ICD-10-CM | POA: Diagnosis present

## 2016-02-27 DIAGNOSIS — F17210 Nicotine dependence, cigarettes, uncomplicated: Secondary | ICD-10-CM | POA: Diagnosis not present

## 2016-02-27 DIAGNOSIS — Y939 Activity, unspecified: Secondary | ICD-10-CM | POA: Insufficient documentation

## 2016-02-27 DIAGNOSIS — Y999 Unspecified external cause status: Secondary | ICD-10-CM | POA: Diagnosis not present

## 2016-02-27 DIAGNOSIS — W06XXXA Fall from bed, initial encounter: Secondary | ICD-10-CM | POA: Insufficient documentation

## 2016-02-27 DIAGNOSIS — M25561 Pain in right knee: Secondary | ICD-10-CM | POA: Insufficient documentation

## 2016-02-27 DIAGNOSIS — M25551 Pain in right hip: Secondary | ICD-10-CM | POA: Insufficient documentation

## 2016-02-27 DIAGNOSIS — Y92009 Unspecified place in unspecified non-institutional (private) residence as the place of occurrence of the external cause: Secondary | ICD-10-CM | POA: Insufficient documentation

## 2016-02-27 DIAGNOSIS — Z951 Presence of aortocoronary bypass graft: Secondary | ICD-10-CM | POA: Insufficient documentation

## 2016-02-27 DIAGNOSIS — J449 Chronic obstructive pulmonary disease, unspecified: Secondary | ICD-10-CM | POA: Insufficient documentation

## 2016-02-27 DIAGNOSIS — W19XXXA Unspecified fall, initial encounter: Secondary | ICD-10-CM

## 2016-02-27 DIAGNOSIS — Z859 Personal history of malignant neoplasm, unspecified: Secondary | ICD-10-CM | POA: Diagnosis not present

## 2016-02-27 LAB — CBC
HEMATOCRIT: 34.3 % — AB (ref 40.0–52.0)
HEMOGLOBIN: 12.3 g/dL — AB (ref 13.0–18.0)
MCH: 32.7 pg (ref 26.0–34.0)
MCHC: 35.9 g/dL (ref 32.0–36.0)
MCV: 91.3 fL (ref 80.0–100.0)
Platelets: 207 10*3/uL (ref 150–440)
RBC: 3.76 MIL/uL — AB (ref 4.40–5.90)
RDW: 14.8 % — AB (ref 11.5–14.5)
WBC: 8.2 10*3/uL (ref 3.8–10.6)

## 2016-02-27 LAB — BASIC METABOLIC PANEL
ANION GAP: 9 (ref 5–15)
BUN: 23 mg/dL — AB (ref 6–20)
CHLORIDE: 96 mmol/L — AB (ref 101–111)
CO2: 30 mmol/L (ref 22–32)
Calcium: 9 mg/dL (ref 8.9–10.3)
Creatinine, Ser: 1.07 mg/dL (ref 0.61–1.24)
GFR calc Af Amer: >60 mL/min (ref >60)
GFR calc non Af Amer: >60 mL/min (ref >60)
Glucose, Bld: 102 mg/dL — ABNORMAL HIGH (ref 65–99)
POTASSIUM: 2.8 mmol/L — AB (ref 3.5–5.1)
SODIUM: 135 mmol/L (ref 135–145)

## 2016-02-27 LAB — URINALYSIS COMPLETE WITH MICROSCOPIC (ARMC ONLY)
BACTERIA UA: NONE SEEN
Bilirubin Urine: NEGATIVE
Glucose, UA: NEGATIVE mg/dL
HGB URINE DIPSTICK: NEGATIVE
Ketones, ur: NEGATIVE mg/dL
LEUKOCYTES UA: NEGATIVE
Nitrite: NEGATIVE
PH: 5 (ref 5.0–8.0)
PROTEIN: NEGATIVE mg/dL
SQUAMOUS EPITHELIAL / LPF: NONE SEEN
Specific Gravity, Urine: 1.015 (ref 1.005–1.030)

## 2016-02-27 LAB — TROPONIN I

## 2016-02-27 LAB — CK: CK TOTAL: 68 U/L (ref 49–397)

## 2016-02-27 MED ORDER — OXYCODONE HCL 5 MG PO TABS
5.0000 mg | ORAL_TABLET | Freq: Once | ORAL | Status: AC
  Administered 2016-02-27: 5 mg via ORAL
  Filled 2016-02-27: qty 1

## 2016-02-27 MED ORDER — POTASSIUM CHLORIDE 10 MEQ/100ML IV SOLN
10.0000 meq | Freq: Once | INTRAVENOUS | Status: AC
  Administered 2016-02-27: 10 meq via INTRAVENOUS
  Filled 2016-02-27: qty 100

## 2016-02-27 MED ORDER — POTASSIUM CHLORIDE CRYS ER 20 MEQ PO TBCR
40.0000 meq | EXTENDED_RELEASE_TABLET | Freq: Once | ORAL | Status: AC
  Administered 2016-02-27: 40 meq via ORAL
  Filled 2016-02-27: qty 2

## 2016-02-27 MED ORDER — OXYCODONE HCL 5 MG PO TABS: 5.0000 mg | ORAL_TABLET | Freq: Three times a day (TID) | ORAL | 0 refills | Status: DC | PRN

## 2016-02-27 MED ORDER — ACETAMINOPHEN 500 MG PO TABS
1000.0000 mg | ORAL_TABLET | Freq: Once | ORAL | Status: AC
  Administered 2016-02-27: 1000 mg via ORAL
  Filled 2016-02-27: qty 2

## 2016-02-27 MED ORDER — POTASSIUM CHLORIDE CRYS ER 15 MEQ PO TBCR: 15.0000 meq | EXTENDED_RELEASE_TABLET | Freq: Two times a day (BID) | ORAL | 1 refills | Status: DC

## 2016-02-27 MED ORDER — SENNA 8.6 MG PO TABS: 1.0000 | ORAL_TABLET | Freq: Every day | ORAL | 0 refills | Status: DC

## 2016-02-27 NOTE — ED Notes (Signed)
Pt assisted to provide urine sample, unable to obtain at this time. Pt did take prescribed ASA '81mg'$  and sertraline this AM. Did not take plavix.

## 2016-02-27 NOTE — ED Provider Notes (Signed)
[facility] Emergency Department Provider Note  ____________________________________________  Time seen: Approximately 11:46 AM  I have reviewed the triage vital signs and the nursing notes.  Level 5 caveat:  Portions of the history and physical were unable to be obtained due to dementia   HISTORY  Chief Complaint Fall   HPI Charles Gallagher is a 82 y.o. male history of dementia, stroke on Plavix, COPD, collagen vascular disease status post CABG who presents for evaluation after unwitnessed fall. Wife found patient laying on the floor next to the bed. Patient has had multiple recent falls. He does have a walker at home. She put him back to bed and in the morning she was trying to get him dressed and started complaining of right hip and lower back pain. She reports that he was in tears which prompted her to call the paramedics. Patient at this time tells me that he has mild pain in the lateral aspect of his right femur. Patient does not have any recollection of the fall. Patient denies headache, chest pain, palpitations, neck pain, back pain, abdominal pain, shortness of breath, leg pain, arm pain.  Past Medical History:  Diagnosis Date  . Cancer ([place])   . Collagen vascular disease ([place])    cabg  . COPD (chronic obstructive pulmonary disease) ([place])   . Dementia   . Stroke [facility])     There are no active problems to display for this patient.   Past Surgical History:  Procedure Laterality Date  . APPENDECTOMY    . CORONARY ARTERY BYPASS GRAFT    . LUNG BIOPSY    . SKIN BIOPSY      Prior to Admission medications   Medication Sig Start Date End Date Taking? Authorizing Provider  aspirin EC 81 MG tablet Take 81 mg by mouth daily.    Historical Provider, MD  b complex vitamins tablet Take 1 tablet by mouth daily.    Historical Provider, MD  clopidogrel (PLAVIX) 75 MG tablet Take 75 mg by mouth daily.    Historical Provider, MD  galantamine (RAZADYNE) 8 MG  tablet Take 8 mg by mouth 2 (two) times daily.    Historical Provider, MD  ondansetron (ZOFRAN ODT) 4 MG disintegrating tablet Take 1 tablet (4 mg total) by mouth every 6 (six) hours as needed for nausea or vomiting. 11/07/14   [name], MD  oxyCODONE (ROXICODONE) 5 MG immediate release tablet Take 1 tablet (5 mg total) by mouth every 8 (eight) hours as needed. 02/27/16 02/26/17  [name], MD  potassium chloride SA (KLOR-CON M15) 15 MEQ tablet Take 1 tablet (15 mEq total) by mouth 2 (two) times daily. 02/27/16   [name], MD  senna (SENOKOT) 8.6 MG TABS tablet Take 1 tablet (8.6 mg total) by mouth daily. 02/27/16   [name], MD  sertraline (ZOLOFT) 50 MG tablet Take 50 mg by mouth daily.    Historical Provider, MD  simvastatin (ZOCOR) 80 MG tablet Take 80 mg by mouth daily.    Historical Provider, MD  tamsulosin (FLOMAX) 0.4 MG CAPS capsule Take 1 capsule (0.4 mg total) by mouth daily. 11/07/14   [name], MD  traMADol (ULTRAM) 50 MG tablet Take by mouth every 6 (six) hours as needed.    Historical Provider, MD    Allergies Review of patient's allergies indicates no known allergies.  No family history on file.  Social History Social History  Substance Use Topics  . Smoking status: Current Some Day  Smoker    Packs/day: 1.00    Years: 25.00    Types: Cigarettes  . Smokeless tobacco: Never Used  . Alcohol use No    Review of Systems Constitutional: Negative for fever. Eyes: Negative for visual changes. ENT: Negative for sore throat. Cardiovascular: Negative for chest pain. Respiratory: Negative for shortness of breath. Gastrointestinal: Negative for abdominal pain, vomiting or diarrhea. Genitourinary: Negative for dysuria. Musculoskeletal: Negative for back pain. + R hip pain Skin: Negative for rash. Neurological: Negative for headaches, weakness or numbness.  ____________________________________________   PHYSICAL EXAM:  VITAL SIGNS: ED Triage Vitals    Enc Vitals Group     BP 02/27/16 1017 (!) 146/79     Pulse Rate 02/27/16 1017 73     Resp 02/27/16 1017 18     Temp 02/27/16 1017 98.3 F (36.8 C)     Temp Source 02/27/16 1017 Oral     SpO2 02/27/16 1017 100 %     Weight 02/27/16 1018 160 lb (72.6 kg)     Height 02/27/16 1018 '5\' 8"'$  (1.727 m)     Head Circumference --      Peak Flow --      Pain Score --      Pain Loc --      Pain Edu? --      Excl. in [place]? --     Full spinal precautions maintained throughout the trauma exam. Constitutional: Alert and oriented. No acute distress. Does not appear intoxicated. HEENT Head: Normocephalic and atraumatic. Face: No facial bony tenderness. Stable midface Ears: No hemotympanum bilaterally. No Battle sign Eyes: No eye injury. PERRL. No raccoon eyes Nose: Nontender. No epistaxis. No rhinorrhea Mouth/Throat: Mucous membranes are moist. No oropharyngeal blood. No dental injury. Airway patent without stridor. Normal voice. Neck: C-collar in place. No midline c-spine tenderness.  Cardiovascular: Normal rate, regular rhythm. Normal and symmetric distal pulses are present in all extremities. Pulmonary/Chest: Chest wall is stable and nontender to palpation/compression. Normal respiratory effort. Breath sounds are normal. No crepitus.  Abdominal: Soft, nontender, non distended. Musculoskeletal: Nontender with normal full range of motion in all extremities. No deformities. No thoracic or lumbar midline spinal tenderness. Pelvis is stable. Mild ttp over the lateral aspect of the R hip. Skin: Skin is warm, dry and intact. No abrasions or contutions. Psychiatric: Speech and behavior are appropriate. Neurological: Normal speech and language. Moves all extremities to command. No gross focal neurologic deficits are appreciated.  Glascow Coma Score: 4 - Opens eyes on own 6 - Follows simple motor commands 5 - Alert and oriented GCS: 15  _________________________________   LABS (all labs ordered are  listed, but only abnormal results are displayed)  Labs Reviewed  BASIC METABOLIC PANEL - Abnormal; Notable for the following:       Result Value   Potassium 2.8 (*)    Chloride 96 (*)    Glucose, Bld 102 (*)    BUN 23 (*)    All other components within normal limits  CBC - Abnormal; Notable for the following:    RBC 3.76 (*)    Hemoglobin 12.3 (*)    HCT 34.3 (*)    RDW 14.8 (*)    All other components within normal limits  URINALYSIS COMPLETEWITH MICROSCOPIC ([facility] ONLY) - Abnormal; Notable for the following:    Color, Urine YELLOW (*)    APPearance CLEAR (*)    All other components within normal limits  CK  TROPONIN I  CBG MONITORING, ED  ____________________________________________  EKG  ED ECG REPORT I, [name], the attending physician, personally viewed and interpreted this ECG.  Normal sinus rhythm, rate of 75, right bundle branch block, prolonged QTC of 513, normal axis, T-wave inversions on the anterior leads. EKG unchanged from prior ____________________________________________  RADIOLOGY  Head CT: negative  CT cervical spine: Negative  XR R hip: negative  XR R knee: Negative ____________________________________________   PROCEDURES  Procedure(s) performed: None Procedures Critical Care performed:  None ____________________________________________   INITIAL IMPRESSION / ASSESSMENT AND PLAN / ED COURSE   82 y.o. male history of dementia, stroke on Plavix, COPD, collagen vascular disease status post CABG who presents for evaluation after unwitnessed fall complaining of pain on the lateral aspect of his right hip. Exam is within normal limits. EKG is nonischemic. Since patient has dementia and does not remember the fall will pursue basic labs including troponin and a UA. We'll get a head CT and CT cervical spine and x-ray of his hip. We'll treat pain with Tylenol and oxycodone.  Clinical Course  Comment By Time  Imaging studies and lab WNL.  Patient ambulating with no pain or difficulty with walker. K supplemented PO and IV. Will dc home on K suplementation. Patient has appointment with PCP in 3 days. I emailed Dr. Sabra Heck, patient's PCP to ask him to re-check patient's K level and also to let him know about patient's multiple falls especially since he is on Plavix. [name], MD 09/04 1449    Pertinent labs & imaging results that were available during my care of the patient were reviewed by me and considered in my medical decision making (see chart for details).    ____________________________________________   FINAL CLINICAL IMPRESSION(S) / ED DIAGNOSES  Final diagnoses:  Fall  Right hip pain  Right knee pain  Hypokalemia      NEW MEDICATIONS STARTED DURING THIS VISIT:  New Prescriptions   OXYCODONE (ROXICODONE) 5 MG IMMEDIATE RELEASE TABLET    Take 1 tablet (5 mg total) by mouth every 8 (eight) hours as needed.   POTASSIUM CHLORIDE SA (KLOR-CON M15) 15 MEQ TABLET    Take 1 tablet (15 mEq total) by mouth 2 (two) times daily.   SENNA (SENOKOT) 8.6 MG TABS TABLET    Take 1 tablet (8.6 mg total) by mouth daily.     Note:  This document was prepared using Dragon voice recognition software and may include unintentional dictation errors.    [name], MD 02/27/16 [phone]

## 2016-02-27 NOTE — ED Triage Notes (Signed)
Pt arrives to ER via [facility] from home after patient found in the floor this AM.. Wife reports that she last saw him in the bed at 10PM last night, wife believes that he fell. Found on floor beside bed. Pt c/o left hip and left knee pain. No deformity or bruising present. Pt denies pain at this time. Hx of dementia, pt poor historian about event.

## 2016-02-27 NOTE — Discharge Instructions (Signed)
Pain control: Take tylenol '1000mg'$  every 8 hours. Take '5mg'$  of oxycodone every 6 hours for breakthrough pain. If you need the oxycodone make sure to take one senokot as well to prevent constipation.  Do not drink alcohol, drive or participate in any other potentially dangerous activities while taking this medication as it may make you sleepy. Do not take this medication with any other sedating medications, either prescription or over-the-counter.   Take potassium as prescribed. Make sure your doctor re-check your potassium level on your next appointment.  Return to the ER for severe headache, back pain, chest pain, shortness of breath, abdominal pain, dizziness, palpitations, or any new symptoms concerning to you.

## 2016-02-27 NOTE — ED Notes (Addendum)
Pt up to ambulate with walker.  Stood by himself with walker and seemed steady on his feet.  Poor endurance noted.  VSS during ambulation.

## 2016-10-26 ENCOUNTER — Emergency Department: Payer: Medicare Other

## 2016-10-26 ENCOUNTER — Inpatient Hospital Stay
Admission: EM | Admit: 2016-10-26 | Discharge: 2016-10-29 | DRG: 603 | Disposition: A | Discharge status: Skilled Nursing Facility | Payer: Medicare Other | Attending: Internal Medicine | Admitting: Internal Medicine

## 2016-10-26 DIAGNOSIS — E785 Hyperlipidemia, unspecified: Secondary | ICD-10-CM | POA: Diagnosis present

## 2016-10-26 DIAGNOSIS — W1830XA Fall on same level, unspecified, initial encounter: Secondary | ICD-10-CM | POA: Diagnosis present

## 2016-10-26 DIAGNOSIS — F0390 Unspecified dementia without behavioral disturbance: Secondary | ICD-10-CM | POA: Diagnosis present

## 2016-10-26 DIAGNOSIS — L03116 Cellulitis of left lower limb: Principal | ICD-10-CM | POA: Diagnosis present

## 2016-10-26 DIAGNOSIS — Z8673 Personal history of transient ischemic attack (TIA), and cerebral infarction without residual deficits: Secondary | ICD-10-CM | POA: Diagnosis not present

## 2016-10-26 DIAGNOSIS — Z7982 Long term (current) use of aspirin: Secondary | ICD-10-CM

## 2016-10-26 DIAGNOSIS — Z823 Family history of stroke: Secondary | ICD-10-CM

## 2016-10-26 DIAGNOSIS — F329 Major depressive disorder, single episode, unspecified: Secondary | ICD-10-CM | POA: Diagnosis present

## 2016-10-26 DIAGNOSIS — Z951 Presence of aortocoronary bypass graft: Secondary | ICD-10-CM

## 2016-10-26 DIAGNOSIS — L03032 Cellulitis of left toe: Secondary | ICD-10-CM

## 2016-10-26 DIAGNOSIS — J441 Chronic obstructive pulmonary disease with (acute) exacerbation: Secondary | ICD-10-CM | POA: Diagnosis present

## 2016-10-26 DIAGNOSIS — Z79899 Other long term (current) drug therapy: Secondary | ICD-10-CM

## 2016-10-26 DIAGNOSIS — Z8249 Family history of ischemic heart disease and other diseases of the circulatory system: Secondary | ICD-10-CM | POA: Diagnosis not present

## 2016-10-26 DIAGNOSIS — E1151 Type 2 diabetes mellitus with diabetic peripheral angiopathy without gangrene: Secondary | ICD-10-CM | POA: Diagnosis present

## 2016-10-26 DIAGNOSIS — Z79891 Long term (current) use of opiate analgesic: Secondary | ICD-10-CM | POA: Diagnosis not present

## 2016-10-26 DIAGNOSIS — E876 Hypokalemia: Secondary | ICD-10-CM | POA: Diagnosis present

## 2016-10-26 DIAGNOSIS — I10 Essential (primary) hypertension: Secondary | ICD-10-CM | POA: Diagnosis present

## 2016-10-26 DIAGNOSIS — F17210 Nicotine dependence, cigarettes, uncomplicated: Secondary | ICD-10-CM | POA: Diagnosis present

## 2016-10-26 DIAGNOSIS — N179 Acute kidney failure, unspecified: Secondary | ICD-10-CM | POA: Diagnosis present

## 2016-10-26 DIAGNOSIS — E860 Dehydration: Secondary | ICD-10-CM | POA: Diagnosis present

## 2016-10-26 DIAGNOSIS — M79672 Pain in left foot: Secondary | ICD-10-CM | POA: Diagnosis not present

## 2016-10-26 DIAGNOSIS — I2510 Atherosclerotic heart disease of native coronary artery without angina pectoris: Secondary | ICD-10-CM | POA: Diagnosis present

## 2016-10-26 HISTORY — DX: Occlusion and stenosis of unspecified carotid artery: I65.29

## 2016-10-26 HISTORY — DX: Hyperlipidemia, unspecified: E78.5

## 2016-10-26 HISTORY — DX: Essential (primary) hypertension: I10

## 2016-10-26 HISTORY — DX: Atherosclerotic heart disease of native coronary artery without angina pectoris: I25.10

## 2016-10-26 HISTORY — DX: Peripheral vascular disease, unspecified: I73.9

## 2016-10-26 LAB — COMPREHENSIVE METABOLIC PANEL
ALBUMIN: 4.6 g/dL (ref 3.5–5.0)
ALK PHOS: 84 U/L (ref 38–126)
ALT: 16 U/L — ABNORMAL LOW (ref 17–63)
AST: 20 U/L (ref 15–41)
Anion gap: 11 (ref 5–15)
BILIRUBIN TOTAL: 1 mg/dL (ref 0.3–1.2)
BUN: 26 mg/dL — ABNORMAL HIGH (ref 6–20)
CALCIUM: 9.7 mg/dL (ref 8.9–10.3)
CO2: 29 mmol/L (ref 22–32)
Chloride: 98 mmol/L — ABNORMAL LOW (ref 101–111)
Creatinine, Ser: 1.4 mg/dL — ABNORMAL HIGH (ref 0.61–1.24)
GFR calc Af Amer: 51 mL/min — ABNORMAL LOW (ref >60)
GFR, EST NON AFRICAN AMERICAN: 44 mL/min — AB (ref >60)
Glucose, Bld: 106 mg/dL — ABNORMAL HIGH (ref 65–99)
POTASSIUM: 3.3 mmol/L — AB (ref 3.5–5.1)
Sodium: 138 mmol/L (ref 135–145)
TOTAL PROTEIN: 8.3 g/dL — AB (ref 6.5–8.1)

## 2016-10-26 LAB — CBC WITH DIFFERENTIAL/PLATELET
BASOS ABS: 0.1 10*3/uL (ref 0–0.1)
BASOS PCT: 1 %
EOS ABS: 0.1 10*3/uL (ref 0–0.7)
EOS PCT: 1 %
HCT: 41.2 % (ref 40.0–52.0)
Hemoglobin: 14.5 g/dL (ref 13.0–18.0)
LYMPHS PCT: 17 %
Lymphs Abs: 2.1 10*3/uL (ref 1.0–3.6)
MCH: 32.8 pg (ref 26.0–34.0)
MCHC: 35.1 g/dL (ref 32.0–36.0)
MCV: 93.6 fL (ref 80.0–100.0)
MONO ABS: 1.2 10*3/uL — AB (ref 0.2–1.0)
Monocytes Relative: 10 %
Neutro Abs: 8.9 10*3/uL — ABNORMAL HIGH (ref 1.4–6.5)
Neutrophils Relative %: 71 %
PLATELETS: 236 10*3/uL (ref 150–440)
RBC: 4.41 MIL/uL (ref 4.40–5.90)
RDW: 15.1 % — AB (ref 11.5–14.5)
WBC: 12.4 10*3/uL — AB (ref 3.8–10.6)

## 2016-10-26 LAB — GLUCOSE, CAPILLARY
GLUCOSE-CAPILLARY: 101 mg/dL — AB (ref 65–99)
GLUCOSE-CAPILLARY: 125 mg/dL — AB (ref 65–99)

## 2016-10-26 LAB — LACTIC ACID, PLASMA: LACTIC ACID, VENOUS: 1.6 mmol/L (ref 0.5–1.9)

## 2016-10-26 MED ORDER — ONDANSETRON HCL 4 MG/2ML IJ SOLN: 4.0000 mg | Freq: Four times a day (QID) | INTRAMUSCULAR | Status: DC | PRN

## 2016-10-26 MED ORDER — CEFAZOLIN SODIUM-DEXTROSE 1-4 GM/50ML-% IV SOLN
1.0000 g | Freq: Three times a day (TID) | INTRAVENOUS | Status: DC
  Administered 2016-10-26 – 2016-10-28 (×5): 1 g via INTRAVENOUS
  Filled 2016-10-26 (×8): qty 50

## 2016-10-26 MED ORDER — HYDROCODONE-ACETAMINOPHEN 5-325 MG PO TABS
1.0000 | ORAL_TABLET | ORAL | Status: DC | PRN
  Administered 2016-10-27 – 2016-10-29 (×3): 1 via ORAL
  Filled 2016-10-26 (×3): qty 1

## 2016-10-26 MED ORDER — ACETAMINOPHEN 650 MG RE SUPP: 650.0000 mg | Freq: Four times a day (QID) | RECTAL | Status: DC | PRN

## 2016-10-26 MED ORDER — ONDANSETRON HCL 4 MG PO TABS: 4.0000 mg | ORAL_TABLET | Freq: Four times a day (QID) | ORAL | Status: DC | PRN

## 2016-10-26 MED ORDER — HEPARIN SODIUM (PORCINE) 5000 UNIT/ML IJ SOLN
5000.0000 [IU] | Freq: Three times a day (TID) | INTRAMUSCULAR | Status: DC
  Administered 2016-10-26 – 2016-10-29 (×8): 5000 [IU] via SUBCUTANEOUS
  Filled 2016-10-26 (×8): qty 1

## 2016-10-26 MED ORDER — INSULIN ASPART 100 UNIT/ML ~~LOC~~ SOLN: 0.0000 [IU] | Freq: Every day | SUBCUTANEOUS | Status: DC

## 2016-10-26 MED ORDER — TRAMADOL HCL 50 MG PO TABS
100.0000 mg | ORAL_TABLET | Freq: Two times a day (BID) | ORAL | Status: DC
  Administered 2016-10-26 – 2016-10-29 (×6): 100 mg via ORAL
  Filled 2016-10-26 (×6): qty 2

## 2016-10-26 MED ORDER — SIMVASTATIN 40 MG PO TABS
40.0000 mg | ORAL_TABLET | Freq: Every day | ORAL | Status: DC
  Administered 2016-10-26 – 2016-10-29 (×4): 40 mg via ORAL
  Filled 2016-10-26 (×4): qty 1

## 2016-10-26 MED ORDER — ACETAMINOPHEN 325 MG PO TABS
650.0000 mg | ORAL_TABLET | Freq: Four times a day (QID) | ORAL | Status: DC | PRN
Start: 2016-10-26 — End: 2016-10-29

## 2016-10-26 MED ORDER — POTASSIUM CHLORIDE CRYS ER 10 MEQ PO TBCR
10.0000 meq | EXTENDED_RELEASE_TABLET | Freq: Every day | ORAL | Status: DC
  Administered 2016-10-27 – 2016-10-29 (×3): 10 meq via ORAL
  Filled 2016-10-26 (×3): qty 1

## 2016-10-26 MED ORDER — SERTRALINE HCL 50 MG PO TABS
50.0000 mg | ORAL_TABLET | Freq: Every day | ORAL | Status: DC
  Administered 2016-10-27 – 2016-10-29 (×3): 50 mg via ORAL
  Filled 2016-10-26 (×3): qty 1

## 2016-10-26 MED ORDER — SENNOSIDES-DOCUSATE SODIUM 8.6-50 MG PO TABS
1.0000 | ORAL_TABLET | Freq: Every evening | ORAL | Status: DC | PRN
  Administered 2016-10-26 – 2016-10-28 (×2): 1 via ORAL
  Filled 2016-10-26 (×2): qty 1

## 2016-10-26 MED ORDER — SODIUM CHLORIDE 0.9 % IV SOLN
INTRAVENOUS | Status: DC
  Administered 2016-10-26: 22:00:00 via INTRAVENOUS

## 2016-10-26 MED ORDER — VANCOMYCIN HCL IN DEXTROSE 1-5 GM/200ML-% IV SOLN
1000.0000 mg | Freq: Once | INTRAVENOUS | Status: AC
  Administered 2016-10-26: 1000 mg via INTRAVENOUS
  Filled 2016-10-26: qty 200

## 2016-10-26 MED ORDER — ASPIRIN EC 81 MG PO TBEC
81.0000 mg | DELAYED_RELEASE_TABLET | Freq: Every day | ORAL | Status: DC
  Administered 2016-10-26 – 2016-10-29 (×4): 81 mg via ORAL
  Filled 2016-10-26 (×4): qty 1

## 2016-10-26 MED ORDER — INSULIN ASPART 100 UNIT/ML ~~LOC~~ SOLN: 0.0000 [IU] | Freq: Three times a day (TID) | SUBCUTANEOUS | Status: DC

## 2016-10-26 MED ORDER — ALBUTEROL SULFATE (2.5 MG/3ML) 0.083% IN NEBU
2.5000 mg | INHALATION_SOLUTION | RESPIRATORY_TRACT | Status: DC | PRN
  Administered 2016-10-26 – 2016-10-27 (×2): 2.5 mg via RESPIRATORY_TRACT
  Filled 2016-10-26 (×3): qty 3

## 2016-10-26 NOTE — ED Notes (Signed)
Report from [facility], rn.

## 2016-10-26 NOTE — ED Notes (Signed)
hospitalist in to see pt.

## 2016-10-26 NOTE — ED Notes (Signed)
ED Provider at bedside. 

## 2016-10-26 NOTE — ED Triage Notes (Addendum)
Pt fell X 2 days ago, unwitnessed, bruising to right side of face and hand. Pt also c/o left foot wound infection, swelling. Redness and swelling present to left foot, no obvious exudate or odor at this time. Pain to left foot is causing increase in falls believed by family. Fell this AM, no new injuries to report from fall this AM. Pt take baby ASA daily. Hx of dementia. Family reports intermittent change in mental status. Pt alert to place, and person. Disoriented to time and situation which family reports as normal.   Family hopeful that patient will be admitted and then "placed".

## 2016-10-26 NOTE — ED Notes (Signed)
Apologized to family for wait to see doctor. Family understandable.

## 2016-10-26 NOTE — ED Notes (Signed)
Pt eating meal tray family has provided.

## 2016-10-26 NOTE — ED Notes (Signed)
EKG performed by Edison Nasuti, EDT

## 2016-10-26 NOTE — ED Provider Notes (Addendum)
[facility] Emergency Department Provider Note   ____________________________________________   First MD Initiated Contact with Patient 10/26/16 1811     (approximate)  I have reviewed the triage vital signs and the nursing notes.   HISTORY  Chief Complaint Fall and Wound Infection  EM caveat: Dementia, patient unable to recall  HPI Charles Gallagher is a 83 y.o. male here for evaluation of recent frequent falls, difficulty walking, and pain in his left foot  Size primary day recommended he come here for further evaluation of bruising over the right eye. Today the patient was attempting to walk and his wife saw him fall. He did not lose consciousness but did fall forward. He has a cut over his right hand as well that was from a fall couple days ago. Family reports his left foot is been red, they think he has an infection and it has been causing trouble with his walking  Even with the assistance of his wife and caretakers at home is not able to walk   Past Medical History:  Diagnosis Date  . CAD (coronary artery disease)   . Cancer ([place])   . Carotid stenosis   . Collagen vascular disease ([place])    cabg  . COPD (chronic obstructive pulmonary disease) ([place])   . Dementia   . Hyperlipidemia   . Hypertension   . PVD (peripheral vascular disease) ([place])   . Stroke [facility])     Patient Active Problem List   Diagnosis Date Noted  . Cellulitis of left foot 10/26/2016    Past Surgical History:  Procedure Laterality Date  . APPENDECTOMY    . CORONARY ARTERY BYPASS GRAFT    . LUNG BIOPSY    . SKIN BIOPSY      Prior to Admission medications   Medication Sig Start Date End Date Taking? Authorizing Provider  acetaminophen (TYLENOL) 500 MG tablet Take 1,000 mg by mouth daily.   Yes Historical Provider, MD  albuterol (PROVENTIL HFA;VENTOLIN HFA) 108 (90 Base) MCG/ACT inhaler Inhale 2 puffs into the lungs every 6 (six) hours as needed for wheezing or shortness  of breath.   Yes Historical Provider, MD  aspirin EC 81 MG tablet Take 81 mg by mouth daily.   Yes Historical Provider, MD  furosemide (LASIX) 40 MG tablet Take 40 mg by mouth daily.   Yes Historical Provider, MD  potassium chloride SA (KLOR-CON M15) 15 MEQ tablet Take 1 tablet (15 mEq total) by mouth 2 (two) times daily. Patient taking differently: Take 10 mEq by mouth daily.  02/27/16  Yes [name], MD  sertraline (ZOLOFT) 50 MG tablet Take 50 mg by mouth daily.   Yes Historical Provider, MD  simvastatin (ZOCOR) 40 MG tablet Take 40 mg by mouth daily.    Yes Historical Provider, MD  traMADol (ULTRAM) 50 MG tablet Take 100 mg by mouth 2 (two) times daily.    Yes Historical Provider, MD  ondansetron (ZOFRAN ODT) 4 MG disintegrating tablet Take 1 tablet (4 mg total) by mouth every 6 (six) hours as needed for nausea or vomiting. Patient not taking: Reported on 10/26/2016 11/07/14   [name], MD  oxyCODONE (ROXICODONE) 5 MG immediate release tablet Take 1 tablet (5 mg total) by mouth every 8 (eight) hours as needed. Patient not taking: Reported on 10/26/2016 02/27/16 02/26/17  [name], MD  senna (SENOKOT) 8.6 MG TABS tablet Take 1 tablet (8.6 mg total) by mouth daily. Patient not taking: Reported on 10/26/2016 02/27/16  [name], MD  tamsulosin (FLOMAX) 0.4 MG CAPS capsule Take 1 capsule (0.4 mg total) by mouth daily. Patient not taking: Reported on 10/26/2016 11/07/14   [name], MD    Allergies Patient has no known allergies.  Family History  Problem Relation Age of Onset  . Stroke Father   . CAD Sister   . CAD Brother     Social History Social History  Substance Use Topics  . Smoking status: Current Some Day Smoker    Packs/day: 1.00    Years: 25.00    Types: Cigarettes  . Smokeless tobacco: Never Used  . Alcohol use No    Review of Systems  EM caveat ____________________________________________   PHYSICAL EXAM:  VITAL SIGNS: ED Triage Vitals  Enc  Vitals Group     BP 10/26/16 1428 131/76     Pulse Rate 10/26/16 1428 83     Resp 10/26/16 1428 20     Temp 10/26/16 1428 98 F (36.7 C)     Temp Source 10/26/16 1718 Oral     SpO2 10/26/16 1428 93 %     Weight --      Height --      Head Circumference --      Peak Flow --      Pain Score --      Pain Loc --      Pain Edu? --      Excl. in [place]? --     Constitutional: Alert and orientedTo himself and family, but not to situation or place. Family reports this is his normal status and he has dementia. Well appearing and in no acute distress. Eyes: Conjunctivae are normal. PERRL. EOMI. Head: Atraumatic except for some ecchymoses noted over the right lateral orbit. Nose: No congestion/rhinnorhea. Mouth/Throat: Mucous membranes are moist.  Oropharynx non-erythematous. Neck: No stridor.   Cardiovascular: Normal rate, regular rhythm. Grossly normal heart sounds.  Good peripheral circulation. Respiratory: Normal respiratory effort.  No retractions. Lungs CTAB. Gastrointestinal: Soft and nontender. No distention.  Musculoskeletal: No lower extremity tenderness nor edema.  No joint effusions. The patient's left foot demonstrates tenderness across the base of the first through third toes, some slight tissue edema, erythema extending over the toes 1 through 4. There is no focal joint effusion or focal joint erythema, no evidence of a septic joint. Neurologic:  Normal speech and language. No gross focal neurologic deficits are appreciated.Skin:  Skin is warm, dry and intact. The right hand demonstrates a small skin tear over the thenar eminence with no bleeding. It appears old, family reports occurred a couple days ago. Psychiatric: Mood and affect are normal. Speech and behavior are normal.  ____________________________________________   LABS (all labs ordered are listed, but only abnormal results are displayed)  Labs Reviewed  COMPREHENSIVE METABOLIC PANEL - Abnormal; Notable for the  following:       Result Value   Potassium 3.3 (*)    Chloride 98 (*)    Glucose, Bld 106 (*)    BUN 26 (*)    Creatinine, Ser 1.40 (*)    Total Protein 8.3 (*)    ALT 16 (*)    GFR calc non Af Amer 44 (*)    GFR calc Af Amer 51 (*)    All other components within normal limits  CBC WITH DIFFERENTIAL/PLATELET - Abnormal; Notable for the following:    WBC 12.4 (*)    RDW 15.1 (*)    Neutro Abs 8.9 (*)  Monocytes Absolute 1.2 (*)    All other components within normal limits  GLUCOSE, CAPILLARY - Abnormal; Notable for the following:    Glucose-Capillary 101 (*)    All other components within normal limits  GLUCOSE, CAPILLARY - Abnormal; Notable for the following:    Glucose-Capillary 125 (*)    All other components within normal limits  CULTURE, BLOOD (ROUTINE X 2)  CULTURE, BLOOD (ROUTINE X 2)  LACTIC ACID, PLASMA  URINALYSIS, COMPLETE (UACMP) WITH MICROSCOPIC  BASIC METABOLIC PANEL  CBC  HEMOGLOBIN A1C   ____________________________________________  EKG   ____________________________________________  RADIOLOGY  Ct Head Wo Contrast  Result Date: 10/26/2016 CLINICAL DATA:  Status post fall. EXAM: CT HEAD WITHOUT CONTRAST TECHNIQUE: Contiguous axial images were obtained from the base of the skull through the vertex without intravenous contrast. COMPARISON:  None. FINDINGS: Brain: No evidence of acute infarction, hemorrhage, hydrocephalus, extra-axial collection or mass lesion/mass effect. Advanced brain parenchymal volume loss and periventricular microangiopathy. Vascular: Atherosclerotic calcifications at the skullbase. Skull: Normal. Negative for fracture or focal lesion. Sinuses/Orbits: No acute finding. Other: None. IMPRESSION: No acute intracranial abnormality. Marked brain parenchymal atrophy and chronic microvascular disease. Electronically Signed   By: Fidela Salisbury M.D.   On: 10/26/2016 15:01   Dg Foot Complete Left  Result Date: 10/26/2016 CLINICAL DATA:  83  y/o  M; left foot wound infection with swelling. EXAM: LEFT FOOT - COMPLETE 3+ VIEW COMPARISON:  None. FINDINGS: There is no evidence of fracture or dislocation. There is no evidence of arthropathy or other focal bone abnormality. Plantar calcaneal enthesophyte. Extensive vascular calcification. Lisfranc alignment is maintained. IMPRESSION: No arthropathy or focal bony abnormality to suggest osteomyelitis. Electronically Signed   By: Kristine Garbe M.D.   On: 10/26/2016 18:47    ____________________________________________   PROCEDURES  Procedure(s) performed: None  Procedures  Critical Care performed: No  ____________________________________________   INITIAL IMPRESSION / ASSESSMENT AND PLAN / ED COURSE  Pertinent labs & imaging results that were available during my care of the patient were reviewed by me and considered in my medical decision making (see chart for details).  Patient presents for evaluation of her falls, doesn't bruising around the right eye, but no evidence of proptosis or actual globe injury. In addition has a old healing skin tear on his right hand. Family reports his immunizations up-to-date  His left foot appears red, with evidence of probable cellulitis over the left base of the foot. Multiple toes. He is unable to walk due to discomfort and is having multiple falls. Discusses family, and they would strongly like him admitted for further evaluation and also probable need for additional assistance and care. I discussed with them that this may be under "observation", and they understand and wish to be admitted even if this may be in a higher burden of cost of them. I think it is reasonable given his multiple falls, now evidenced by his injury and inability ambulate to admit him. Start IV antibiotic, further care by hospitalist      ____________________________________________   FINAL CLINICAL IMPRESSION(S) / ED DIAGNOSES  Final diagnoses:  Cellulitis  of toe of left foot      NEW MEDICATIONS STARTED DURING THIS VISIT:  Current Discharge Medication List       Note:  This document was prepared using Dragon voice recognition software and may include unintentional dictation errors.     [name], MD 10/27/16 7829    [name], MD 10/27/16 [phone]

## 2016-10-26 NOTE — Progress Notes (Signed)
Advanced Care Plan.  Purpose of Encounter: :  Code Status. Parties in Attendance: The patient, his daughter and son-in-law, and me. Patient's Decisional Capacity: No. Medical Story: The patient is a 83 years old Caucasian male with multiple medical history including CAD, PVD, hypertension, COPD and dementia. The patient is being admitted for left foot cellulitis and weakness. Per her daughter ([place]), the patient has multiple falls due to weakness, his condition is declining. I discussed the patient's CODE STATUS with his daughter, who decided full code for now. She will discuss with her mother who is first POA about CODE STATUS.  Plan:  Code Status: Full code for now. Time spent discussing advance care planning: 17 minutes.

## 2016-10-26 NOTE — ED Notes (Signed)
Pt resting, call bell at right side. vss. Family at bedside. resps unlabored.

## 2016-10-26 NOTE — H&P (Signed)
Charles Gallagher at [facility] NAME: Charles Gallagher    MR#:  [id number]  DATE OF BIRTH:  1928/12/28  DATE OF ADMISSION:  10/26/2016  PRIMARY CARE PHYSICIAN: [name], MD   REQUESTING/REFERRING PHYSICIAN: [name], MD  CHIEF COMPLAINT:   Chief Complaint  Patient presents with  . Fall  . Wound Infection   Fall and left foot infection. HISTORY OF PRESENT ILLNESS:  Charles Gallagher  is a 83 y.o. male with a known history of HTN, CAD, PVD, COPD, stroke, collagen vascular disease, cancer and dementia. The patient was sent from home to the ED due to above chief complaints. Patient is demented, unable to provide any information. According to his daughter, the patient had frequent falls, difficulty walking and left foot pain recently. She found his left foot is red, swelling and tender. She also notices that he has bruise on right orbital area. The patient was found had leukocytosis and left foot infection. ED physician requested admission.  PAST MEDICAL HISTORY:   Past Medical History:  Diagnosis Date  . CAD (coronary artery disease)   . Cancer ([place])   . Carotid stenosis   . Collagen vascular disease ([place])    cabg  . COPD (chronic obstructive pulmonary disease) ([place])   . Dementia   . Hyperlipidemia   . Hypertension   . PVD (peripheral vascular disease) ([place])   . Stroke [facility])     PAST SURGICAL HISTORY:   Past Surgical History:  Procedure Laterality Date  . APPENDECTOMY    . CORONARY ARTERY BYPASS GRAFT    . LUNG BIOPSY    . SKIN BIOPSY      SOCIAL HISTORY:   Social History  Substance Use Topics  . Smoking status: Current Some Day Smoker    Packs/day: 1.00    Years: 25.00    Types: Cigarettes  . Smokeless tobacco: Never Used  . Alcohol use No    FAMILY HISTORY:   Family History  Problem Relation Age of Onset  . Stroke Father   . CAD Sister   . CAD Brother     DRUG ALLERGIES:  No Known Allergies  REVIEW OF SYSTEMS:    Review of Systems  Unable to perform ROS: Dementia    MEDICATIONS AT HOME:   Prior to Admission medications   Medication Sig Start Date End Date Taking? Authorizing Provider  acetaminophen (TYLENOL) 500 MG tablet Take 1,000 mg by mouth daily.   Yes [provider]  albuterol (PROVENTIL HFA;VENTOLIN HFA) 108 (90 Base) MCG/ACT inhaler Inhale 2 puffs into the lungs every 6 (six) hours as needed for wheezing or shortness of breath.   Yes [provider]  aspirin EC 81 MG tablet Take 81 mg by mouth daily.   Yes [provider]  furosemide (LASIX) 40 MG tablet Take 40 mg by mouth daily.   Yes [provider]  potassium chloride SA (KLOR-CON M15) 15 MEQ tablet Take 1 tablet (15 mEq total) by mouth 2 (two) times daily. Patient taking differently: Take 10 mEq by mouth daily.  02/27/16  Yes [place], [place], MD  sertraline (ZOLOFT) 50 MG tablet Take 50 mg by mouth daily.   Yes [provider]  simvastatin (ZOCOR) 40 MG tablet Take 40 mg by mouth daily.    Yes [provider]  traMADol (ULTRAM) 50 MG tablet Take 100 mg by mouth 2 (two) times daily.    Yes [provider]  ondansetron [facility]  ODT) 4 MG disintegrating tablet Take 1 tablet (4 mg total) by mouth every 6 (six) hours as needed for nausea or vomiting. Patient not taking: Reported on 10/26/2016 11/07/14   [name], MD  oxyCODONE (ROXICODONE) 5 MG immediate release tablet Take 1 tablet (5 mg total) by mouth every 8 (eight) hours as needed. Patient not taking: Reported on 10/26/2016 02/27/16 02/26/17  [name], MD  senna (SENOKOT) 8.6 MG TABS tablet Take 1 tablet (8.6 mg total) by mouth daily. Patient not taking: Reported on 10/26/2016 02/27/16   [name], MD  tamsulosin (FLOMAX) 0.4 MG CAPS capsule Take 1 capsule (0.4 mg total) by mouth daily. Patient not taking: Reported on 10/26/2016 11/07/14   [name], MD      VITAL SIGNS:  Blood pressure 132/74, pulse 76,  temperature 98.6 F (37 C), temperature source Oral, resp. rate (!) 24, SpO2 92 %.  PHYSICAL EXAMINATION:  Physical Exam  GENERAL:  83 y.o.-year-old patient lying in the bed with no acute distress.  EYES: Pupils equal, round, reactive to light and accommodation. No scleral icterus. Extraocular muscles intact. Bruise on right eyebrow. HEENT: Head atraumatic, normocephalic. Oropharynx and nasopharynx clear.  NECK:  Supple, no jugular venous distention. No thyroid enlargement, no tenderness.  LUNGS: Normal breath sounds bilaterally, no wheezing, rales,rhonchi or crepitation. No use of accessory muscles of respiration.  CARDIOVASCULAR: S1, S2 normal. No murmurs, rubs, or gallops.  ABDOMEN: Soft, nontender, nondistended. Bowel sounds present. No organomegaly or mass.  EXTREMITIES: No pedal edema, cyanosis, or clubbing. Left foot swelling, erythema and tenderness. No obvious abscess. NEUROLOGIC: Cranial nerves II through XII are intact. Muscle strength 3-4/5 in all extremities. Sensation intact. Gait not checked.  PSYCHIATRIC: The patient is demented, but follow commands.  SKIN: No obvious rash, lesion, or ulcer.   LABORATORY PANEL:   CBC  Recent Labs Lab 10/26/16 1434  WBC 12.4*  HGB 14.5  HCT 41.2  PLT 236   ------------------------------------------------------------------------------------------------------------------  Chemistries   Recent Labs Lab 10/26/16 1434  NA 138  K 3.3*  CL 98*  CO2 29  GLUCOSE 106*  BUN 26*  CREATININE 1.40*  CALCIUM 9.7  AST 20  ALT 16*  ALKPHOS 84  BILITOT 1.0   ------------------------------------------------------------------------------------------------------------------  Cardiac Enzymes No results for input(s): TROPONINI in the last 168 hours. ------------------------------------------------------------------------------------------------------------------  RADIOLOGY:  Ct Head Wo Contrast  Result Date: 10/26/2016 CLINICAL DATA:   Status post fall. EXAM: CT HEAD WITHOUT CONTRAST TECHNIQUE: Contiguous axial images were obtained from the base of the skull through the vertex without intravenous contrast. COMPARISON:  None. FINDINGS: Brain: No evidence of acute infarction, hemorrhage, hydrocephalus, extra-axial collection or mass lesion/mass effect. Advanced brain parenchymal volume loss and periventricular microangiopathy. Vascular: Atherosclerotic calcifications at the skullbase. Skull: Normal. Negative for fracture or focal lesion. Sinuses/Orbits: No acute finding. Other: None. IMPRESSION: No acute intracranial abnormality. Marked brain parenchymal atrophy and chronic microvascular disease. Electronically Signed   By: Fidela Salisbury M.D.   On: 10/26/2016 15:01   Dg Foot Complete Left  Result Date: 10/26/2016 CLINICAL DATA:  84 y/o  M; left foot wound infection with swelling. EXAM: LEFT FOOT - COMPLETE 3+ VIEW COMPARISON:  None. FINDINGS: There is no evidence of fracture or dislocation. There is no evidence of arthropathy or other focal bone abnormality. Plantar calcaneal enthesophyte. Extensive vascular calcification. Lisfranc alignment is maintained. IMPRESSION: No arthropathy or focal bony abnormality to suggest osteomyelitis. Electronically Signed   By: Kristine Garbe M.D.   On: 10/26/2016 18:47  IMPRESSION AND PLAN:   Left foot Cellulitis with leukocytosis.  The patient will be admitted to medical floor. Start IV Ancef, follow-up CBC.  Acute renal failure due to dehydration. Start IV fluid support and follow-up BMP. Hypokalemia. Give potassium supplement, follow-up BMP and magnesium level. Generalized weakness and fall. Physical therapy evaluation and social worker consult for placement.   All the records are reviewed and case discussed with ED provider. Management plans discussed with the patient, his daughter and they are in agreement.  CODE STATUS: Full code per daughter  TOTAL TIME TAKING  CARE OF THIS PATIENT: 53 minutes.    Demetrios Loll M.D on 10/26/2016 at 8:41 PM  Between 7am to 6pm - Pager - [phone]  After 6pm go to [email or web address] - Proofreader  [facility]  Office  [phone]  CC: Primary care physician; [name], MD   Note: This dictation was prepared with Dragon dictation along with smaller phrase technology. Any transcriptional errors that result from this process are unintentional.

## 2016-10-27 LAB — CBC
HEMATOCRIT: 31 % — AB (ref 40.0–52.0)
Hemoglobin: 11.2 g/dL — ABNORMAL LOW (ref 13.0–18.0)
MCH: 33.8 pg (ref 26.0–34.0)
MCHC: 36.2 g/dL — AB (ref 32.0–36.0)
MCV: 93.6 fL (ref 80.0–100.0)
Platelets: 169 10*3/uL (ref 150–440)
RBC: 3.31 MIL/uL — ABNORMAL LOW (ref 4.40–5.90)
RDW: 14.8 % — AB (ref 11.5–14.5)
WBC: 9.7 10*3/uL (ref 3.8–10.6)

## 2016-10-27 LAB — BASIC METABOLIC PANEL
Anion gap: 7 (ref 5–15)
BUN: 21 mg/dL — AB (ref 6–20)
CO2: 28 mmol/L (ref 22–32)
CREATININE: 1.1 mg/dL (ref 0.61–1.24)
Calcium: 8.7 mg/dL — ABNORMAL LOW (ref 8.9–10.3)
Chloride: 101 mmol/L (ref 101–111)
GFR calc Af Amer: >60 mL/min (ref >60)
GFR calc non Af Amer: 59 mL/min — ABNORMAL LOW (ref >60)
GLUCOSE: 100 mg/dL — AB (ref 65–99)
POTASSIUM: 3 mmol/L — AB (ref 3.5–5.1)
SODIUM: 136 mmol/L (ref 135–145)

## 2016-10-27 LAB — GLUCOSE, CAPILLARY
GLUCOSE-CAPILLARY: 103 mg/dL — AB (ref 65–99)
GLUCOSE-CAPILLARY: 94 mg/dL (ref 65–99)
GLUCOSE-CAPILLARY: 72 mg/dL (ref 65–99)

## 2016-10-27 MED ORDER — TIOTROPIUM BROMIDE MONOHYDRATE 18 MCG IN CAPS
18.0000 ug | ORAL_CAPSULE | Freq: Two times a day (BID) | RESPIRATORY_TRACT | Status: DC
  Administered 2016-10-27 – 2016-10-29 (×5): 18 ug via RESPIRATORY_TRACT
  Filled 2016-10-27: qty 5

## 2016-10-27 MED ORDER — POTASSIUM CHLORIDE CRYS ER 20 MEQ PO TBCR
40.0000 meq | EXTENDED_RELEASE_TABLET | Freq: Once | ORAL | Status: AC
  Administered 2016-10-27: 40 meq via ORAL
  Filled 2016-10-27: qty 2

## 2016-10-27 MED ORDER — ALBUTEROL SULFATE (2.5 MG/3ML) 0.083% IN NEBU: 2.5000 mg | INHALATION_SOLUTION | Freq: Four times a day (QID) | RESPIRATORY_TRACT | Status: DC | PRN

## 2016-10-27 NOTE — Plan of Care (Signed)
Problem: Education: Goal: Knowledge of [facility] Education information/materials will improve Outcome: Not Progressing Pt confused  Problem: Health Behavior/Discharge Planning: Goal: Ability to manage health-related needs will improve Outcome: Not Progressing Pt requires assistance  Problem: Education: Goal: Verbalization of understanding the information provided will improve Outcome: Not Progressing Pt confused

## 2016-10-27 NOTE — Clinical Social Work Note (Addendum)
CSW received consult for possible SNF placement. CSW will follow pending PT recommendations.  PT delivered verbal recommendation for SNF. CSW assessment to follow.  Santiago Bumpers, MSW, Latanya Presser [phone]

## 2016-10-27 NOTE — Progress Notes (Signed)
Patient's daughter is at bedside - she stated that Mr. [place]ington takes spiriva twice a day and albuterol PRN - neither inhaler is ordered at this time. Patient is wheezing. PTA med list was updated to include spiriva. Daughter also asked why we were checking his blood sugar so often - stated patient does not have a history of DM. Notified Dr. Jannifer Franklin of these concerns. MD reviewed chart; stated that he would order inhalers and D/C SSI.

## 2016-10-27 NOTE — Evaluation (Signed)
Physical Therapy Evaluation Patient Details Name: Charles Gallagher MRN: [id number] DOB: 13-Jul-1928 Today's Date: 10/27/2016   History of Present Illness  83 yo male with onset of cellulitis L foot is painful on both feet, has been unable to walk well for a month prior to this.  Has leukocytosis,  PMHx:  PVD, HTN, CAD, cerebral atrophy, COPD, stroke, CA, falls, dementia   Clinical Impression  Pt is notably not able to stand, very dependent and unable to move to get up to bedside and back well.  He is going to require 2 person assist at all times until more strength and balance skills are achieved but definitely impacted by pain and stiffness in feet and ankles.  Will follow acutely for strengthening and transfers, progress to gait when able to shorten need for SNF care afterward.    Follow Up Recommendations SNF    Equipment Recommendations  None recommended by PT    Recommendations for Other Services       Precautions / Restrictions Precautions Precautions: Fall Precaution Comments: nursing to use mechanical lift Restrictions Weight Bearing Restrictions: No      Mobility  Bed Mobility Overal bed mobility: Needs Assistance Bed Mobility: Supine to Sit;Sit to Supine     Supine to sit: Max assist Sit to supine: Total assist   General bed mobility comments: pt is not following instructions well, requires both tactile and verbal cues to go along with PT  Transfers Overall transfer level: Needs assistance Equipment used: Rolling walker (2 wheeled);1 person hand held assist Transfers: Sit to/from Stand Sit to Stand: Total assist;From elevated surface (partial stand very reluctant and complaining about feet)            Ambulation/Gait             General Gait Details: unable  Stairs            Wheelchair Mobility    Modified Rankin (Stroke Patients Only)       Balance Overall balance assessment: History of Falls;Needs assistance Sitting-balance support:  Feet supported Sitting balance-Leahy Scale: Fair                                       Pertinent Vitals/Pain Pain Assessment: Faces Pain Score: 8  Faces Pain Scale: Hurts whole lot Pain Location: grimacing on any touch of B feet and ankles Pain Intervention(s): Limited activity within patient's tolerance;Monitored during session;Repositioned    Home Living Family/patient expects to be discharged to:: Skilled nursing facility Living Arrangements: Spouse/significant other                    Prior Function Level of Independence: Needs assistance   Gait / Transfers Assistance Needed: 1-2 person assist esp for stairs, which are recently avoided due to shuffling gait  ADL's / Homemaking Assistance Needed: wife cares for  home        Hand Dominance        Extremity/Trunk Assessment   Upper Extremity Assessment Upper Extremity Assessment: Generalized weakness    Lower Extremity Assessment Lower Extremity Assessment: Generalized weakness    Cervical / Trunk Assessment Cervical / Trunk Assessment: Kyphotic  Communication   Communication: Expressive difficulties;HOH (augmented hearing)  Cognition Arousal/Alertness: Lethargic Behavior During Therapy: Flat affect Overall Cognitive Status: History of cognitive impairments - at baseline  General Comments      Exercises     Assessment/Plan    PT Assessment Patient needs continued PT services  PT Problem List Decreased strength;Decreased range of motion;Decreased activity tolerance;Decreased balance;Decreased mobility;Decreased coordination;Decreased cognition;Decreased knowledge of use of DME;Decreased safety awareness;Decreased skin integrity;Pain       PT Treatment Interventions DME instruction;Gait training;Stair training;Functional mobility training;Therapeutic activities;Therapeutic exercise;Balance training;Neuromuscular  re-education;Patient/family education    PT Goals (Current goals can be found in the Care Plan section)  Acute Rehab PT Goals Patient Stated Goal: none stated PT Goal Formulation: With family Time For Goal Achievement: 11/10/16 Potential to Achieve Goals: Fair    Frequency Min 2X/week   Barriers to discharge Decreased caregiver support;Inaccessible home environment (in home setting with stairs, not able to walk)      Co-evaluation               AM-PAC PT "6 Clicks" Daily Activity  Outcome Measure Difficulty turning over in bed (including adjusting bedclothes, sheets and blankets)?: Total Difficulty moving from lying on back to sitting on the side of the bed? : Total Difficulty sitting down on and standing up from a chair with arms (e.g., wheelchair, bedside commode, etc,.)?: Total Help needed moving to and from a bed to chair (including a wheelchair)?: Total Help needed walking in hospital room?: Total Help needed climbing 3-5 steps with a railing? : Total 6 Click Score: 6    End of Session Equipment Utilized During Treatment: Gait belt Activity Tolerance: Patient limited by pain Patient left: in bed;with call bell/phone within reach;with nursing/sitter in room;with family/visitor present Nurse Communication: Mobility status PT Visit Diagnosis: Difficulty in walking, not elsewhere classified (R26.2);Muscle weakness (generalized) (M62.81)    Time: 5051-8335 PT Time Calculation (min) (ACUTE ONLY): 34 min   Charges:   PT Evaluation $PT Eval Moderate Complexity: 1 Procedure PT Treatments $Therapeutic Activity: 8-22 mins   PT G Codes:   PT G-Codes **NOT FOR INPATIENT CLASS** Functional Assessment Tool Used: AM-PAC 6 Clicks Basic Mobility    Ramond Dial 10/27/2016, 11:24 AM   Mee Hives, PT MS Acute Rehab Dept. Number: [place] and [place]

## 2016-10-27 NOTE — Clinical Social Work Note (Signed)
Clinical Social Work Assessment  Patient Details  Name: Charles Gallagher MRN: [id number] Date of Birth: [birth year]  Date of referral:  10/27/16               Reason for consult:  Facility Placement                Permission sought to share information with:  Chartered certified accountant granted to share information::  Yes, Verbal Permission Granted  Name::        Agency::     Relationship::     Contact Information:     Housing/Transportation Living arrangements for the past 2 months:  Single Family Home Source of Information:  Adult Children, Spouse, Medical Team Patient Interpreter Needed:  None Criminal Activity/Legal Involvement Pertinent to Current Situation/Hospitalization:  No - Comment as needed Significant Relationships:  Adult Children, Community Support, [place], Spouse Lives with:  Spouse Do you feel safe going back to the place where you live?  Yes Need for family participation in patient care:  Yes (Comment)  Care giving concerns: PT recommendation for SNF   Social Worker assessment / plan:  CSW met with the patient, his wife, and his daughter at bedside to discuss discharge planning. The patient's daughter reported that the family would like to pursue SNF placement with possible memory care placement after he finishes STR. The CSW informed the family about the Medicare regulations concerning payment for SNF and qualifying stay. The family indicated that the most important factor for them is that the patient would not have to move from one facility to another; instead, they would prefer that he transition within the STR facility. The CSW cautioned the family of possible barriers to this, and the CSW advised that the receiving facility SW would be able to assist further with that planning. The preferences are as follow: 1. [facility]. 2. [facility]. 3. [facility]. 4. [facility].   The CSW has entered the referral and all needed documentation at this time. The patient is  most likely going to discharge on Monday if stable.  Employment status:  Retired Forensic scientist:  [employer] PT Recommendations:  [place] / Referral to community resources:  [place]  Patient/Family's Response to care:  The patient's family thanked the CSW for assistance.  Patient/Family's Understanding of and Emotional Response to Diagnosis, Current Treatment, and Prognosis:  The patient's family is in agreement with the discharge plan.  Emotional Assessment Appearance:  Appears younger than stated age Attitude/Demeanor/Rapport:   (Pleasant and quiet) Affect (typically observed):  Pleasant, Appropriate Orientation:  Oriented to Self, Oriented to Place, Oriented to Situation Alcohol / Substance use:  Never Used Psych involvement (Current and /or in the community):  No (Comment)  Discharge Needs  Concerns to be addressed:  Care Coordination, Discharge Planning Concerns Readmission within the last 30 days:  No Current discharge risk:  None Barriers to Discharge:  Continued Medical Work up   [employer], [place] 10/27/2016, 2:36 PM

## 2016-10-27 NOTE — Progress Notes (Signed)
[place] at [place] NAME: Charles Gallagher    MR#:  [id number]  DATE OF BIRTH:  1928-07-02  SUBJECTIVE:   Cellulitis is improving. Family is concerned about patient being discharged and feel that patient will need skilled nursing facility due to falls.  REVIEW OF SYSTEMS:    Review of Systems  Constitutional: Negative for fever, chills weight loss HENT: Negative for ear pain, nosebleeds, congestion, facial swelling, rhinorrhea, neck pain, neck stiffness and ear discharge.   Respiratory: Negative for cough, shortness of breath, wheezing  Cardiovascular: Negative for chest pain, palpitations and leg swelling.  Gastrointestinal: Negative for heartburn, abdominal pain, vomiting, diarrhea or consitpation Genitourinary: Negative for dysuria, urgency, frequency, hematuria Musculoskeletal: Negative for back pain or joint pain Positive falls Neurological: Negative for dizziness, seizures, syncope, focal weakness,  numbness and headaches.  Hematological: Does not bruise/bleed easily.  Psychiatric/Behavioral: Negative for hallucinations, confusion, dysphoric mood Skin: Cellulitis left foot   Tolerating Diet: yes      DRUG ALLERGIES:  No Known Allergies  VITALS:  Blood pressure (!) 120/59, pulse 69, temperature 98 F (36.7 C), temperature source Oral, resp. rate 18, height '5\' 8"'$  (1.727 m), weight 78.1 kg (172 lb 1.6 oz), SpO2 94 %.  PHYSICAL EXAMINATION:  Constitutional: Appears well-developed and well-nourished. No distress. HENT: Normocephalic. Marland Kitchen Oropharynx is clear and moist.  Eyes: Conjunctivae and EOM are normal. PERRLA, no scleral icterus.  Neck: Normal ROM. Neck supple. No JVD. No tracheal deviation. CVS: RRR, S1/S2 +, no murmurs, no gallops, no carotid bruit.  Pulmonary: Effort and breath sounds normal, no stridor, rhonchi, wheezes, rales.  Abdominal: Soft. BS +,  no distension, tenderness, rebound or guarding.  Musculoskeletal:  Normal range of motion. No edema and no tenderness.  Neuro: Alert. CN 2-12 grossly intact. No focal deficits. Skin: Left foot erythema has much improved  Psychiatric: Normal mood and affect.      LABORATORY PANEL:   CBC  Recent Labs Lab 10/27/16 0518  WBC 9.7  HGB 11.2*  HCT 31.0*  PLT 169   ------------------------------------------------------------------------------------------------------------------  Chemistries   Recent Labs Lab 10/26/16 1434 10/27/16 0518  NA 138 136  K 3.3* 3.0*  CL 98* 101  CO2 29 28  GLUCOSE 106* 100*  BUN 26* 21*  CREATININE 1.40* 1.10  CALCIUM 9.7 8.7*  AST 20  --   ALT 16*  --   ALKPHOS 84  --   BILITOT 1.0  --    ------------------------------------------------------------------------------------------------------------------  Cardiac Enzymes No results for input(s): TROPONINI in the last 168 hours. ------------------------------------------------------------------------------------------------------------------  RADIOLOGY:  Ct Head Wo Contrast  Result Date: 10/26/2016 CLINICAL DATA:  Status post fall. EXAM: CT HEAD WITHOUT CONTRAST TECHNIQUE: Contiguous axial images were obtained from the base of the skull through the vertex without intravenous contrast. COMPARISON:  None. FINDINGS: Brain: No evidence of acute infarction, hemorrhage, hydrocephalus, extra-axial collection or mass lesion/mass effect. Advanced brain parenchymal volume loss and periventricular microangiopathy. Vascular: Atherosclerotic calcifications at the skullbase. Skull: Normal. Negative for fracture or focal lesion. Sinuses/Orbits: No acute finding. Other: None. IMPRESSION: No acute intracranial abnormality. Marked brain parenchymal atrophy and chronic microvascular disease. Electronically Signed   By: Fidela Salisbury M.D.   On: 10/26/2016 15:01   Dg Foot Complete Left  Result Date: 10/26/2016 CLINICAL DATA:  83 y/o  M; left foot wound infection with swelling.  EXAM: LEFT FOOT - COMPLETE 3+ VIEW COMPARISON:  None. FINDINGS: There is no evidence of fracture or dislocation. There is  no evidence of arthropathy or other focal bone abnormality. Plantar calcaneal enthesophyte. Extensive vascular calcification. Lisfranc alignment is maintained. IMPRESSION: No arthropathy or focal bony abnormality to suggest osteomyelitis. Electronically Signed   By: Kristine Garbe M.D.   On: 10/26/2016 18:47     ASSESSMENT AND PLAN:    83 year old male with COPD and dementia who presents with falls and found to have cellulitis.  1. Left foot cellulitis with leukocytosis: Cellulitis is improved as well as white blood cell count. Continue to elevate foot. Continue IV Ancef and plan to change oral medications at discharge  2. Acute kidney injury due to dehydration: This is improving with IV fluids. Discontinue IV fluids. 3. Hypokalemia: Replete and recheck in a.m.  4. Hyperlipidemia: Continue statin  5. COPD without exacerbation  6. Depression/dementia: Continue Zoloft  7. Diabetes on sliding scale insulin and ADA diet  Awaiting PT evaluation for disposition planning  Management plans discussed with the patient and family and they are in agreement.  CODE STATUS:  Full code  TOTAL TIME TAKING CARE OF THIS PATIENT: 30 minutes.     POSSIBLE D/C today, DEPENDING ON CLINICAL CONDITION.   Nekeisha Aure M.D on 10/27/2016 at 9:34 AM  Between 7am to 6pm - Pager - [phone] After 6pm go to [email or web address] - password [email or web address] [facility]  Office  [phone]  CC: Primary care physician; [name], MD  Note: This dictation was prepared with Dragon dictation along with smaller phrase technology. Any transcriptional errors that result from this process are unintentional.

## 2016-10-27 NOTE — NC FL2 (Signed)
[place] LEVEL OF CARE SCREENING TOOL     IDENTIFICATION  Patient Name: Charles Gallagher Birthdate: 03-18-1929 Sex: male Admission Date (Current Location): 10/26/2016  [place] and [place] Number:  Engineering geologist and Address:  [facility], [address] [place], [place] [address]      Provider Number: [id number]  Attending Physician Name and Address:  Bettey Costa, MD  Relative Name and Phone Number:       Current Level of Care: Hospital Recommended Level of Care: [place] Prior Approval Number:    Date Approved/Denied: 10/27/16 PASRR Number: [id number]  Discharge Plan: SNF    Current Diagnoses: Patient Active Problem List   Diagnosis Date Noted  . Cellulitis of left foot 10/26/2016    Orientation RESPIRATION BLADDER Height & Weight     Self, Time, Situation  Normal Continent Weight: 172 lb 1.6 oz (78.1 kg) Height:  '5\' 8"'$  (172.7 cm)  BEHAVIORAL SYMPTOMS/MOOD NEUROLOGICAL BOWEL NUTRITION STATUS      Continent    AMBULATORY STATUS COMMUNICATION OF NEEDS Skin   Extensive Assist Verbally Skin abrasions, Other (Comment) (Cellulitis)                       Personal Care Assistance Level of Assistance  Bathing, Feeding, Dressing Bathing Assistance: Limited assistance Feeding assistance: Independent Dressing Assistance: Limited assistance     Functional Limitations Info             SPECIAL CARE FACTORS FREQUENCY  PT (By licensed PT)     PT Frequency: Up to 5X per day 5 days per week              Contractures Contractures Info: Present    Additional Factors Info                  Current Medications (10/27/2016):  This is the current hospital active medication list Current Facility-Administered Medications  Medication Dose Route Frequency Provider Last Rate Last Dose  . acetaminophen (TYLENOL) tablet 650 mg  650 mg Oral Q6H PRN Demetrios Loll, MD       Or  . acetaminophen (TYLENOL)  suppository 650 mg  650 mg Rectal Q6H PRN Demetrios Loll, MD      . albuterol (PROVENTIL) (2.5 MG/3ML) 0.083% nebulizer solution 2.5 mg  2.5 mg Nebulization Q2H PRN Demetrios Loll, MD   2.5 mg at 10/26/16 2259  . aspirin EC tablet 81 mg  81 mg Oral Daily Demetrios Loll, MD   81 mg at 10/27/16 1049  . ceFAZolin (ANCEF) IVPB 1 g/50 mL premix  1 g Intravenous Q8H Demetrios Loll, MD   Stopped at 10/27/16 [phone]  . heparin injection 5,000 Units  5,000 Units Subcutaneous Q8H Demetrios Loll, MD   5,000 Units at 10/27/16 0507  . HYDROcodone-acetaminophen (NORCO/VICODIN) 5-325 MG per tablet 1-2 tablet  1-2 tablet Oral Q4H PRN Demetrios Loll, MD      . insulin aspart (novoLOG) injection 0-5 Units  0-5 Units Subcutaneous QHS Demetrios Loll, MD      . insulin aspart (novoLOG) injection 0-9 Units  0-9 Units Subcutaneous TID WC Demetrios Loll, MD      . ondansetron [facility]) tablet 4 mg  4 mg Oral Q6H PRN Demetrios Loll, MD       Or  . ondansetron [facility]) injection 4 mg  4 mg Intravenous Q6H PRN Demetrios Loll, MD      . potassium chloride (K-DUR,KLOR-CON) CR tablet 10 mEq  10 mEq Oral Daily Demetrios Loll, MD   10 mEq at 10/27/16 1049  . senna-docusate (Senokot-S) tablet 1 tablet  1 tablet Oral QHS PRN Demetrios Loll, MD   1 tablet at 10/26/16 2222  . sertraline (ZOLOFT) tablet 50 mg  50 mg Oral Daily Demetrios Loll, MD   50 mg at 10/27/16 1049  . simvastatin (ZOCOR) tablet 40 mg  40 mg Oral Daily Demetrios Loll, MD   40 mg at 10/27/16 1049  . traMADol (ULTRAM) tablet 100 mg  100 mg Oral BID Demetrios Loll, MD   100 mg at 10/27/16 1048     Discharge Medications: Please see discharge summary for a list of discharge medications.  Relevant Imaging Results:  Relevant Lab Results:   Additional Information SS# [id number]  Zettie Pho, LCSW

## 2016-10-28 LAB — BASIC METABOLIC PANEL
Anion gap: 8 (ref 5–15)
BUN: 21 mg/dL — AB (ref 6–20)
CO2: 26 mmol/L (ref 22–32)
CREATININE: 1.24 mg/dL (ref 0.61–1.24)
Calcium: 8.6 mg/dL — ABNORMAL LOW (ref 8.9–10.3)
Chloride: 101 mmol/L (ref 101–111)
GFR calc Af Amer: 59 mL/min — ABNORMAL LOW (ref >60)
GFR, EST NON AFRICAN AMERICAN: 51 mL/min — AB (ref >60)
GLUCOSE: 97 mg/dL (ref 65–99)
POTASSIUM: 3.8 mmol/L (ref 3.5–5.1)
SODIUM: 135 mmol/L (ref 135–145)

## 2016-10-28 LAB — HEMOGLOBIN A1C
HEMOGLOBIN A1C: 5.2 % (ref 4.8–5.6)
MEAN PLASMA GLUCOSE: 103 mg/dL

## 2016-10-28 MED ORDER — METHYLPREDNISOLONE SODIUM SUCC 125 MG IJ SOLR
60.0000 mg | Freq: Once | INTRAMUSCULAR | Status: AC
  Administered 2016-10-28: 60 mg via INTRAVENOUS
  Filled 2016-10-28: qty 2

## 2016-10-28 MED ORDER — CEPHALEXIN 500 MG PO CAPS
500.0000 mg | ORAL_CAPSULE | Freq: Three times a day (TID) | ORAL | Status: DC
  Administered 2016-10-28 – 2016-10-29 (×3): 500 mg via ORAL
  Filled 2016-10-28 (×4): qty 1

## 2016-10-28 MED ORDER — PREDNISONE 50 MG PO TABS
50.0000 mg | ORAL_TABLET | Freq: Every day | ORAL | Status: DC
  Administered 2016-10-28 – 2016-10-29 (×2): 50 mg via ORAL
  Filled 2016-10-28: qty 1

## 2016-10-28 NOTE — Progress Notes (Signed)
Unable to collect urinalysis this shift; patient is incontinent.

## 2016-10-28 NOTE — Plan of Care (Signed)
Problem: Education: Goal: Knowledge of [facility] Education information/materials will improve Outcome: Not Progressing Pt confused  Problem: Health Behavior/Discharge Planning: Goal: Ability to manage health-related needs will improve Outcome: Not Progressing Pt requires assistance

## 2016-10-28 NOTE — Progress Notes (Signed)
[place] at [place] NAME: Charles Gallagher    MR#:  [id number]  DATE OF BIRTH:  1928/10/14  SUBJECTIVE:   Cellulitis has improved Has wheezing  Restarted on home inhalers  Daughter and nurse at bedside No acute events overnight REVIEW OF SYSTEMS:    Review of Systems  This am unable to obtain ROS dementia  Tolerating Diet: yes      DRUG ALLERGIES:  No Known Allergies  VITALS:  Blood pressure 126/68, pulse 77, temperature 98.7 F (37.1 C), temperature source Oral, resp. rate 17, height '5\' 8"'$  (1.727 m), weight 78.1 kg (172 lb 1.6 oz), SpO2 94 %.  PHYSICAL EXAMINATION:  Constitutional: Appears well-developed and well-nourished. No distress. HENT: Normocephalic. Marland Kitchen Oropharynx is clear and moist.  Eyes: Conjunctivae and EOM are normal. PERRLA, no scleral icterus.  Neck: Normal ROM. Neck supple. No JVD. No tracheal deviation. CVS: RRR, S1/S2 +, no murmurs, no gallops, no carotid bruit.  Pulmonary: Effort b/l wheezing with good airflow Abdominal: Soft. BS +,  no distension, tenderness, rebound or guarding.  Musculoskeletal: Normal range of motion. No edema and no tenderness.  Neuro: Alert oriented to name place. CN 2-12 grossly intact. No focal deficits. Skin:cellulitis resolved left foot Psychiatric: Normal mood and affect.      LABORATORY PANEL:   CBC  Recent Labs Lab 10/27/16 0518  WBC 9.7  HGB 11.2*  HCT 31.0*  PLT 169   ------------------------------------------------------------------------------------------------------------------  Chemistries   Recent Labs Lab 10/26/16 1434 10/27/16 0518  NA 138 136  K 3.3* 3.0*  CL 98* 101  CO2 29 28  GLUCOSE 106* 100*  BUN 26* 21*  CREATININE 1.40* 1.10  CALCIUM 9.7 8.7*  AST 20  --   ALT 16*  --   ALKPHOS 84  --   BILITOT 1.0  --    ------------------------------------------------------------------------------------------------------------------  Cardiac  Enzymes No results for input(s): TROPONINI in the last 168 hours. ------------------------------------------------------------------------------------------------------------------  RADIOLOGY:  Ct Head Wo Contrast  Result Date: 10/26/2016 CLINICAL DATA:  Status post fall. EXAM: CT HEAD WITHOUT CONTRAST TECHNIQUE: Contiguous axial images were obtained from the base of the skull through the vertex without intravenous contrast. COMPARISON:  None. FINDINGS: Brain: No evidence of acute infarction, hemorrhage, hydrocephalus, extra-axial collection or mass lesion/mass effect. Advanced brain parenchymal volume loss and periventricular microangiopathy. Vascular: Atherosclerotic calcifications at the skullbase. Skull: Normal. Negative for fracture or focal lesion. Sinuses/Orbits: No acute finding. Other: None. IMPRESSION: No acute intracranial abnormality. Marked brain parenchymal atrophy and chronic microvascular disease. Electronically Signed   By: Fidela Salisbury M.D.   On: 10/26/2016 15:01   Dg Foot Complete Left  Result Date: 10/26/2016 CLINICAL DATA:  83 y/o  M; left foot wound infection with swelling. EXAM: LEFT FOOT - COMPLETE 3+ VIEW COMPARISON:  None. FINDINGS: There is no evidence of fracture or dislocation. There is no evidence of arthropathy or other focal bone abnormality. Plantar calcaneal enthesophyte. Extensive vascular calcification. Lisfranc alignment is maintained. IMPRESSION: No arthropathy or focal bony abnormality to suggest osteomyelitis. Electronically Signed   By: Kristine Garbe M.D.   On: 10/26/2016 18:47     ASSESSMENT AND PLAN:    83 year old male with COPD and dementia who presents with falls and found to have cellulitis.  1. Left foot cellulitis with leukocytosis: Cellulitis has almost resolved. Change to PO keflex treat for 3 more days.  Continue to elevate foot.  2. Acute kidney injury due to dehydration: This has improved with IVF.  3. Hypokalemia:Am  labs pending I will follow up   4. Hyperlipidemia: Continue statin  5. COPD with mild exacerbation: I will start prednisone One dose of IV this am Continue nebs and inhalers  6. Depression/dementia: Continue Zoloft    PT RECS SNF CSW aware D/w daughter  Management plans discussed with the patient and family and they are in agreement.  CODE STATUS:  Full code  TOTAL TIME TAKING CARE OF THIS PATIENT: 24 minutes.     POSSIBLE D/C tomorrow SNF DEPENDING ON CLINICAL CONDITION.   Charles Gallagher M.D on 10/28/2016 at 6:37 AM  Between 7am to 6pm - Pager - [phone] After 6pm go to [email or web address] - password [email or web address] [place] [place]  Office  [phone]  CC: Primary care physician; [name], MD  Note: This dictation was prepared with Dragon dictation along with smaller phrase technology. Any transcriptional errors that result from this process are unintentional.

## 2016-10-29 MED ORDER — FLEET ENEMA 7-19 GM/118ML RE ENEM
1.0000 | ENEMA | Freq: Once | RECTAL | Status: AC
  Administered 2016-10-29: 1 via RECTAL

## 2016-10-29 MED ORDER — OXYCODONE HCL 5 MG PO TABS: 5.0000 mg | ORAL_TABLET | Freq: Three times a day (TID) | ORAL | 0 refills | Status: DC | PRN

## 2016-10-29 MED ORDER — POLYETHYLENE GLYCOL 3350 17 G PO PACK
17.0000 g | PACK | Freq: Every day | ORAL | Status: DC | PRN
  Administered 2016-10-29: 17 g via ORAL
  Filled 2016-10-29: qty 1

## 2016-10-29 MED ORDER — CEPHALEXIN 500 MG PO CAPS: 500.0000 mg | ORAL_CAPSULE | Freq: Three times a day (TID) | ORAL | 0 refills | Status: AC

## 2016-10-29 MED ORDER — POTASSIUM CHLORIDE CRYS ER 15 MEQ PO TBCR: 10.0000 meq | EXTENDED_RELEASE_TABLET | Freq: Every day | ORAL | Status: DC

## 2016-10-29 MED ORDER — PREDNISONE 50 MG PO TABS: ORAL_TABLET | ORAL | 0 refills | Status: DC

## 2016-10-29 MED ORDER — TRAMADOL HCL 50 MG PO TABS: 100.0000 mg | ORAL_TABLET | Freq: Two times a day (BID) | ORAL | 0 refills | Status: DC

## 2016-10-29 NOTE — Discharge Summary (Signed)
Charles Gallagher at [place] NAME: Charles Gallagher    MR#:  [id number]  DATE OF BIRTH:  [birth year]  DATE OF ADMISSION:  10/26/2016 ADMITTING PHYSICIAN: Demetrios Loll, MD  DATE OF DISCHARGE: 10/29/2016 PRIMARY CARE PHYSICIAN: Charles Aus, MD    ADMISSION DIAGNOSIS:  fall  DISCHARGE DIAGNOSIS:  Active Problems:   Cellulitis of left foot   SECONDARY DIAGNOSIS:   Past Medical History:  Diagnosis Date  . CAD (coronary artery disease)   . Cancer ([place])   . Carotid stenosis   . Collagen vascular disease ([place])    cabg  . COPD (chronic obstructive pulmonary disease) ([place])   . Dementia   . Hyperlipidemia   . Hypertension   . PVD (peripheral vascular disease) ([place])   . Stroke [facility])     HOSPITAL COURSE:  83 year old male with COPD and dementia who presents with falls and found to have cellulitis.  1. Left foot cellulitis with leukocytosis: Cellulitis has almost resolved. Change to PO keflex treat for 2 more days.   2. Acute kidney injury due to dehydration: This is stable 3. Hypokalemia: This was repleted. He is on potassium supplementation at home  4. Hyperlipidemia: Continue statin  5. COPD with mild exacerbation: He will continue prednisone oral for 3 more days. Continue inhalers  6. Depression/dementia: Continue Zoloft   DISCHARGE CONDITIONS AND DIET:   Stable for discharge on regular diet  CONSULTS OBTAINED:    DRUG ALLERGIES:  No Known Allergies  DISCHARGE MEDICATIONS:   Current Discharge Medication List    START taking these medications   Details  cephALEXin (KEFLEX) 500 MG capsule Take 1 capsule (500 mg total) by mouth every 8 (eight) hours. Qty: 6 capsule, Refills: 0    predniSONE (DELTASONE) 50 MG tablet Take 1 tablet daily for 3 days Qty: 3 tablet, Refills: 0      CONTINUE these medications which have CHANGED   Details  oxyCODONE (ROXICODONE) 5 MG immediate release tablet Take 1 tablet (5 mg total) by  mouth every 8 (eight) hours as needed. Qty: 12 tablet, Refills: 0    potassium chloride SA (KLOR-CON M15) 15 MEQ tablet Take 0.5 tablets (7.5 mEq total) by mouth daily.      CONTINUE these medications which have NOT CHANGED   Details  acetaminophen (TYLENOL) 500 MG tablet Take 1,000 mg by mouth daily.    albuterol (PROVENTIL HFA;VENTOLIN HFA) 108 (90 Base) MCG/ACT inhaler Inhale 2 puffs into the lungs every 6 (six) hours as needed for wheezing or shortness of breath.    aspirin EC 81 MG tablet Take 81 mg by mouth daily.    furosemide (LASIX) 40 MG tablet Take 40 mg by mouth daily.    sertraline (ZOLOFT) 50 MG tablet Take 50 mg by mouth daily.    simvastatin (ZOCOR) 40 MG tablet Take 40 mg by mouth daily.     tiotropium (SPIRIVA) 18 MCG inhalation capsule Place 18 mcg into inhaler and inhale 2 (two) times daily.    traMADol (ULTRAM) 50 MG tablet Take 100 mg by mouth 2 (two) times daily.     ondansetron (ZOFRAN ODT) 4 MG disintegrating tablet Take 1 tablet (4 mg total) by mouth every 6 (six) hours as needed for nausea or vomiting. Qty: 20 tablet, Refills: 0    senna (SENOKOT) 8.6 MG TABS tablet Take 1 tablet (8.6 mg total) by mouth daily. Qty: 30 each, Refills: 0      STOP taking these medications  tamsulosin (FLOMAX) 0.4 MG CAPS capsule           Today   CHIEF COMPLAINT:  No acute events overnight. Daughter at bedside   VITAL SIGNS:  Blood pressure (!) 97/56, pulse 64, temperature 97.5 F (36.4 C), temperature source Oral, resp. rate 18, height '5\' 8"'$  (1.727 m), weight 78.1 kg (172 lb 1.6 oz), SpO2 91 %.   REVIEW OF SYSTEMS:  Review of Systems  Unable to perform ROS: Dementia     PHYSICAL EXAMINATION:  GENERAL:  83 y.o.-year-old patient lying in the bed with no acute distress.  NECK:  Supple, no jugular venous distention. No thyroid enlargement, no tenderness.  LUNGS: Normal breath sounds bilaterally, no wheezing, rales,rhonchi  No use of accessory  muscles of respiration.  CARDIOVASCULAR: S1, S2 normal. No murmurs, rubs, or gallops.  ABDOMEN: Soft, non-tender, non-distended. Bowel sounds present. No organomegaly or mass.  EXTREMITIES: No pedal edema, cyanosis, or clubbing.  PSYCHIATRIC: The patient is alert and oriented x name and placed.  SKIN: Left foot cellulitis is improved DATA REVIEW:   CBC  Recent Labs Lab 10/27/16 0518  WBC 9.7  HGB 11.2*  HCT 31.0*  PLT 169    Chemistries   Recent Labs Lab 10/26/16 1434  10/28/16 0617  NA 138  < > 135  K 3.3*  < > 3.8  CL 98*  < > 101  CO2 29  < > 26  GLUCOSE 106*  < > 97  BUN 26*  < > 21*  CREATININE 1.40*  < > 1.24  CALCIUM 9.7  < > 8.6*  AST 20  --   --   ALT 16*  --   --   ALKPHOS 84  --   --   BILITOT 1.0  --   --   < > = values in this interval not displayed.  Cardiac Enzymes No results for input(s): TROPONINI in the last 168 hours.  Microbiology Results  '@MICRORSLT48'$ @  RADIOLOGY:  No results found.    Current Discharge Medication List    START taking these medications   Details  cephALEXin (KEFLEX) 500 MG capsule Take 1 capsule (500 mg total) by mouth every 8 (eight) hours. Qty: 6 capsule, Refills: 0    predniSONE (DELTASONE) 50 MG tablet Take 1 tablet daily for 3 days Qty: 3 tablet, Refills: 0      CONTINUE these medications which have CHANGED   Details  oxyCODONE (ROXICODONE) 5 MG immediate release tablet Take 1 tablet (5 mg total) by mouth every 8 (eight) hours as needed. Qty: 12 tablet, Refills: 0    potassium chloride SA (KLOR-CON M15) 15 MEQ tablet Take 0.5 tablets (7.5 mEq total) by mouth daily.      CONTINUE these medications which have NOT CHANGED   Details  acetaminophen (TYLENOL) 500 MG tablet Take 1,000 mg by mouth daily.    albuterol (PROVENTIL HFA;VENTOLIN HFA) 108 (90 Base) MCG/ACT inhaler Inhale 2 puffs into the lungs every 6 (six) hours as needed for wheezing or shortness of breath.    aspirin EC 81 MG tablet Take 81 mg  by mouth daily.    furosemide (LASIX) 40 MG tablet Take 40 mg by mouth daily.    sertraline (ZOLOFT) 50 MG tablet Take 50 mg by mouth daily.    simvastatin (ZOCOR) 40 MG tablet Take 40 mg by mouth daily.     tiotropium (SPIRIVA) 18 MCG inhalation capsule Place 18 mcg into inhaler and inhale 2 (two) times daily.  traMADol (ULTRAM) 50 MG tablet Take 100 mg by mouth 2 (two) times daily.     ondansetron (ZOFRAN ODT) 4 MG disintegrating tablet Take 1 tablet (4 mg total) by mouth every 6 (six) hours as needed for nausea or vomiting. Qty: 20 tablet, Refills: 0    senna (SENOKOT) 8.6 MG TABS tablet Take 1 tablet (8.6 mg total) by mouth daily. Qty: 30 each, Refills: 0      STOP taking these medications     tamsulosin (FLOMAX) 0.4 MG CAPS capsule             Management plans discussed with the patient and daughter and she is in agreement. Stable for discharge SNF  Patient should follow up with pcp  CODE STATUS:     Code Status Orders        Start     Ordered   10/26/16 2146  Full code  Continuous     10/26/16 2145    Code Status History    Date Active Date Inactive Code Status Order ID Comments User Context   This patient has a current code status but no historical code status.    Advance Directive Documentation     Most Recent Value  Type of Advance Directive  Healthcare Power of Attorney  Pre-existing out of facility DNR order (yellow form or pink MOST form)  -  "MOST" Form in Place?  -      TOTAL TIME TAKING CARE OF THIS PATIENT: 3 23mnutes.    Note: This dictation was prepared with Dragon dictation along with smaller phrase technology. Any transcriptional errors that result from this process are unintentional.  Uva Runkel M.D on 10/29/2016 at 7:07 AM  Between 7am to 6pm - Pager - [phone] After 6pm go to [email or web address] - password [email or web address]  Office  [phone] CC: Primary care physician; MRusty Aus MD

## 2016-10-29 NOTE — Care Management (Signed)
Patient to discharge to SNF.  RNCM signing off.  Please re consult if needed

## 2016-10-29 NOTE — Care Management Important Message (Signed)
Important Message  Patient Details  Name: LAVONTAE CORNIA MRN: [id number] Date of Birth: Aug 12, 1928   Medicare Important Message Given:  Yes    Beverly Sessions, RN 10/29/2016, 10:17 AM

## 2016-10-29 NOTE — Progress Notes (Signed)
Pt is to be discharged per MD order. IV removed. Instructions reviewed with family. Discharge packet prepared by [facility] from [place]. Report called to [place] [facility]. Per their policy pt has to have had BM within two days, this pt has not had BM in four days. Administered miralax and enema. Pt eventually had small BM. Family prefers if pt receives pain meds prior to transport, this RN gave ordered norco. Awaiting EMS

## 2016-10-29 NOTE — Clinical Social Work Note (Signed)
Bed offers have been extended to patient's wife and daughter. They have chosen [facility]. Discharge information has been sent. Nurse to call report. Patient's wife and daughter request EMS transport.  Shela Leff MSW,MPH [phone]

## 2016-10-31 LAB — CULTURE, BLOOD (ROUTINE X 2)
CULTURE: NO GROWTH
Culture: NO GROWTH
SPECIAL REQUESTS: ADEQUATE

## 2016-11-07 ENCOUNTER — Emergency Department
Admission: EM | Admit: 2016-11-07 | Discharge: 2016-11-07 | Disposition: A | Discharge status: Home / Self Care | Payer: Medicare Other | Attending: Emergency Medicine | Admitting: Emergency Medicine

## 2016-11-07 ENCOUNTER — Encounter: Payer: Self-pay | Admitting: *Deleted

## 2016-11-07 DIAGNOSIS — Z79899 Other long term (current) drug therapy: Secondary | ICD-10-CM | POA: Diagnosis not present

## 2016-11-07 DIAGNOSIS — I10 Essential (primary) hypertension: Secondary | ICD-10-CM | POA: Diagnosis not present

## 2016-11-07 DIAGNOSIS — Z7982 Long term (current) use of aspirin: Secondary | ICD-10-CM | POA: Diagnosis not present

## 2016-11-07 DIAGNOSIS — Y999 Unspecified external cause status: Secondary | ICD-10-CM | POA: Diagnosis not present

## 2016-11-07 DIAGNOSIS — Y939 Activity, unspecified: Secondary | ICD-10-CM | POA: Diagnosis not present

## 2016-11-07 DIAGNOSIS — J449 Chronic obstructive pulmonary disease, unspecified: Secondary | ICD-10-CM | POA: Diagnosis not present

## 2016-11-07 DIAGNOSIS — W1839XA Other fall on same level, initial encounter: Secondary | ICD-10-CM | POA: Diagnosis not present

## 2016-11-07 DIAGNOSIS — S52501A Unspecified fracture of the lower end of right radius, initial encounter for closed fracture: Secondary | ICD-10-CM

## 2016-11-07 DIAGNOSIS — I2510 Atherosclerotic heart disease of native coronary artery without angina pectoris: Secondary | ICD-10-CM | POA: Diagnosis not present

## 2016-11-07 DIAGNOSIS — F17210 Nicotine dependence, cigarettes, uncomplicated: Secondary | ICD-10-CM | POA: Insufficient documentation

## 2016-11-07 DIAGNOSIS — S6991XA Unspecified injury of right wrist, hand and finger(s), initial encounter: Secondary | ICD-10-CM | POA: Diagnosis present

## 2016-11-07 DIAGNOSIS — Y929 Unspecified place or not applicable: Secondary | ICD-10-CM | POA: Diagnosis not present

## 2016-11-07 DIAGNOSIS — S52591A Other fractures of lower end of right radius, initial encounter for closed fracture: Secondary | ICD-10-CM | POA: Insufficient documentation

## 2016-11-07 NOTE — ED Notes (Signed)
Discussed discharge instructions and follow-up care with patient's care giver. No questions or concerns at this time. Pt stable at discharge.  

## 2016-11-07 NOTE — ED Provider Notes (Signed)
[facility] Emergency Department Provider Note   ____________________________________________   I have reviewed the triage vital signs and the nursing notes.   HISTORY  Chief Complaint Wrist Pain    HPI Charles Gallagher is a 83 y.o. male presents with right wrist pain secondary to wrist fracture sustained following a fall from a standing position approximate 4 days ago at his residence, [facility]. A mobile x-ray imaged and the right wrist found fractured. Patients wrist immobilized by the facility and an Orthopedic appointment scheduled. Patient has been transported to the ED due to inability of the splint to stay in place. The caregiver present reports patient "fidgets with his hands and removes the splint several times a day". Paperwork sent by the facility included imaging report. Patient denies fever, chills, headache, vision changes, chest pain, chest tightness, shortness of breath,nausea and vomiting.  Past Medical History:  Diagnosis Date  . CAD (coronary artery disease)   . Cancer ([place])   . Carotid stenosis   . Collagen vascular disease ([place])    cabg  . COPD (chronic obstructive pulmonary disease) ([place])   . Dementia   . Hyperlipidemia   . Hypertension   . PVD (peripheral vascular disease) ([place])   . Stroke [facility])     Patient Active Problem List   Diagnosis Date Noted  . Cellulitis of left foot 10/26/2016    Past Surgical History:  Procedure Laterality Date  . APPENDECTOMY    . CORONARY ARTERY BYPASS GRAFT    . LUNG BIOPSY    . SKIN BIOPSY      Prior to Admission medications   Medication Sig Start Date End Date Taking? Authorizing Provider  acetaminophen (TYLENOL) 500 MG tablet Take 1,000 mg by mouth daily.    [provider]  albuterol (PROVENTIL HFA;VENTOLIN HFA) 108 (90 Base) MCG/ACT inhaler Inhale 2 puffs into the lungs every 6 (six) hours as needed for wheezing or shortness of breath.    [provider]    aspirin EC 81 MG tablet Take 81 mg by mouth daily.    [provider]  furosemide (LASIX) 40 MG tablet Take 40 mg by mouth daily.    [provider]  ondansetron (ZOFRAN ODT) 4 MG disintegrating tablet Take 1 tablet (4 mg total) by mouth every 6 (six) hours as needed for nausea or vomiting. Patient not taking: Reported on 10/26/2016 11/07/14   [name], MD  oxyCODONE (ROXICODONE) 5 MG immediate release tablet Take 1 tablet (5 mg total) by mouth every 8 (eight) hours as needed. 10/29/16 10/29/17  Bettey Costa, MD  potassium chloride SA (KLOR-CON M15) 15 MEQ tablet Take 0.5 tablets (7.5 mEq total) by mouth daily. 10/29/16   Bettey Costa, MD  predniSONE (DELTASONE) 50 MG tablet Take 1 tablet daily for 3 days 10/29/16   Bettey Costa, MD  senna (SENOKOT) 8.6 MG TABS tablet Take 1 tablet (8.6 mg total) by mouth daily. Patient not taking: Reported on 10/26/2016 02/27/16   [name], MD  sertraline (ZOLOFT) 50 MG tablet Take 50 mg by mouth daily.    [provider]  simvastatin (ZOCOR) 40 MG tablet Take 40 mg by mouth daily.     [provider]  tiotropium (SPIRIVA) 18 MCG inhalation capsule Place 18 mcg into inhaler and inhale 2 (two) times daily.    [provider]  traMADol (ULTRAM) 50 MG tablet Take 2 tablets (100 mg total) by mouth 2 (two) times daily. 10/29/16  Henreitta Leber, MD    Allergies Patient has no known allergies.  Family History  Problem Relation Age of Onset  . Stroke Father   . CAD Sister   . CAD Brother     Social History Social History  Substance Use Topics  . Smoking status: Current Some Day Smoker    Packs/day: 1.00    Years: 25.00    Types: Cigarettes  . Smokeless tobacco: Never Used  . Alcohol use No    Review of Systems Constitutional:  Negative for fever/chills Cardiovascular: Denies chest pain. Respiratory: Denies cough Denies shortness of breath. Gastrointestinal: No abdominal pain.  No nausea, vomiting,  diarrhea. Genitourinary: Negative for dysuria. Musculoskeletal: Right wrist pain Skin: Negative for rash. Neurological: Negative for headaches.  Negative focal weakness or numbness. Negatie for loss of consciousness. Nonambulatory baseline ____________________________________________   PHYSICAL EXAM:  VITAL SIGNS: ED Triage Vitals  Enc Vitals Group     BP 11/07/16 1137 (!) 147/71     Pulse Rate 11/07/16 1137 75     Resp 11/07/16 1137 18     Temp 11/07/16 1137 98.1 F (36.7 C)     Temp Source 11/07/16 1137 Oral     SpO2 11/07/16 1137 92 %     Weight 11/07/16 1138 180 lb (81.6 kg)     Height 11/07/16 1138 '5\' 8"'$  (1.727 m)     Head Circumference --      Peak Flow --      Pain Score --      Pain Loc --      Pain Edu? --      Excl. in [place]? --     Constitutional: Alert and oriented. Well appearing and in no acute distress.  Head: Normocephalic and atraumatic. Eyes: Conjunctivae are normal.  Cardiovascular: Normal rate, regular rhythm. Normal distal pulses. Respiratory: Normal respiratory effort. Lungs CTAB Gastrointestinal: Soft and nontender. No distention. Musculoskeletal: Nontender with normal range of motion in all extremities except right wrist. Right wrist pain with ROM. Intact sensation of wrist, hand and fingers. No deformities noted. Normal capillary refill.  Neurologic: Normal speech and language. No gross focal neurologic deficits are appreciated.   Skin:  Skin is warm, dry and intact. No rash noted. Psychiatric: Mood and affect are normal. _________________________   LABS (all labs ordered are listed, but only abnormal results are displayed)  Labs Reviewed - No data to display ____________________________________________  EKG none ____________________________________________  RADIOLOGY  Radiology report from facility Acute mildly impacted distal radius fracture seen. No hand fracture. The bones appear diffusely demineralized demineralized. Modest soft tissue  swelling noted. Conclusion: Acute mildly impacted distal radius fracture. ____________________________________________   PROCEDURES  Procedure(s) performed: SPLINT APPLICATION Date/Time: 2:95 PM Authorized by: Jerolyn Shin Consent: Verbal consent obtained. Risks and benefits: risks, benefits and alternatives were discussed Consent given by: patient Splint applied by: EMT technician Location details: Right wrist-forearm Splint type: Volar wrist-forearm Supplies used: Ortho-glass and ACE wrap Post-procedure: The splinted body part was neurovascularly unchanged following the procedure. Patient tolerance: Patient tolerated the procedure well with no immediate complications.      Critical Care performed: no ____________________________________________   INITIAL IMPRESSION / ASSESSMENT AND PLAN / ED COURSE  Pertinent labs & imaging results that were available during my care of the patient were reviewed by me and considered in my medical decision making (see chart for details).  Patient presented with right wrist pain secondary to wrist fracture sustained following a fall from a standing position  approximate 4 days ago at his residence, [facility]. The facility provided temporary splint and scheduled an orthopedic appointment however, the splint was not sufficient and the facility transported for evaluation in the emergency department. The right wrist did not present with  gross deformities, sensation changes and had normal capillary refill on examination. Based on the mobile imaging report and physical physical examination findings an Ortho-Glass volar splint with Ace wrap was applied to the right wrist. Following splint application, neurovascular assessment the lower extremity was intact. Recommended to the caregiver present, to reassess the splint often, the splint may incur shifting or unknown readjustment. The patient is known to remove splints and braces with prior injuries.  Patient will be following up with the scheduled orthopedic appointment. Caregiver present informed if symptoms worsen or there are neurovascular changes to return to the emergency department.   Patient / Family / Caregiver informed of clinical course, understand medical decision-making process, and agree with plan.  Patient was advised to follow up Orthopedics and was also advised to return to the emergency department for symptoms that change or worsen if unable to schedule an appointment.     ____________________________________________   FINAL CLINICAL IMPRESSION(S) / ED DIAGNOSES  Final diagnoses:  Closed fracture of distal end of right radius, unspecified fracture morphology, initial encounter      NEW MEDICATIONS STARTED DURING THIS VISIT:  Discharge Medication List as of 11/07/2016 12:17 PM       Note:  This document was prepared using Dragon voice recognition software and may include unintentional dictation errors.   Jerolyn Shin, PA-C 11/07/16 1646    Darel Hong, MD 11/08/16 1247

## 2016-11-07 NOTE — ED Triage Notes (Signed)
Per EMS report, patient fell over the weekend at [facility].Patient  Had right wrist x-ray today which showed a fracture. Patient is unable to see orthopedist until Friday and was transferred here for further evaluation.

## 2017-06-15 ENCOUNTER — Emergency Department: Payer: Medicare Other

## 2017-06-15 ENCOUNTER — Inpatient Hospital Stay
Admission: EM | Admit: 2017-06-15 | Discharge: 2017-06-25 | DRG: 871 | Disposition: E | Payer: Medicare Other | Attending: Internal Medicine | Admitting: Internal Medicine

## 2017-06-15 ENCOUNTER — Encounter: Payer: Self-pay | Admitting: Emergency Medicine

## 2017-06-15 DIAGNOSIS — Z8673 Personal history of transient ischemic attack (TIA), and cerebral infarction without residual deficits: Secondary | ICD-10-CM

## 2017-06-15 DIAGNOSIS — Z66 Do not resuscitate: Secondary | ICD-10-CM | POA: Diagnosis present

## 2017-06-15 DIAGNOSIS — H9190 Unspecified hearing loss, unspecified ear: Secondary | ICD-10-CM | POA: Diagnosis present

## 2017-06-15 DIAGNOSIS — A419 Sepsis, unspecified organism: Principal | ICD-10-CM | POA: Diagnosis present

## 2017-06-15 DIAGNOSIS — E870 Hyperosmolality and hypernatremia: Secondary | ICD-10-CM | POA: Diagnosis present

## 2017-06-15 DIAGNOSIS — Z515 Encounter for palliative care: Secondary | ICD-10-CM | POA: Diagnosis present

## 2017-06-15 DIAGNOSIS — R0902 Hypoxemia: Secondary | ICD-10-CM

## 2017-06-15 DIAGNOSIS — F0390 Unspecified dementia without behavioral disturbance: Secondary | ICD-10-CM | POA: Diagnosis present

## 2017-06-15 DIAGNOSIS — J9811 Atelectasis: Secondary | ICD-10-CM | POA: Diagnosis present

## 2017-06-15 DIAGNOSIS — F17210 Nicotine dependence, cigarettes, uncomplicated: Secondary | ICD-10-CM | POA: Diagnosis present

## 2017-06-15 DIAGNOSIS — E785 Hyperlipidemia, unspecified: Secondary | ICD-10-CM | POA: Diagnosis present

## 2017-06-15 DIAGNOSIS — E43 Unspecified severe protein-calorie malnutrition: Secondary | ICD-10-CM | POA: Diagnosis present

## 2017-06-15 DIAGNOSIS — J90 Pleural effusion, not elsewhere classified: Secondary | ICD-10-CM

## 2017-06-15 DIAGNOSIS — J9621 Acute and chronic respiratory failure with hypoxia: Secondary | ICD-10-CM | POA: Diagnosis present

## 2017-06-15 DIAGNOSIS — N179 Acute kidney failure, unspecified: Secondary | ICD-10-CM | POA: Diagnosis present

## 2017-06-15 DIAGNOSIS — L89151 Pressure ulcer of sacral region, stage 1: Secondary | ICD-10-CM | POA: Diagnosis present

## 2017-06-15 DIAGNOSIS — E860 Dehydration: Secondary | ICD-10-CM | POA: Diagnosis present

## 2017-06-15 DIAGNOSIS — C3402 Malignant neoplasm of left main bronchus: Secondary | ICD-10-CM | POA: Diagnosis present

## 2017-06-15 DIAGNOSIS — L8990 Pressure ulcer of unspecified site, unspecified stage: Secondary | ICD-10-CM

## 2017-06-15 DIAGNOSIS — I2510 Atherosclerotic heart disease of native coronary artery without angina pectoris: Secondary | ICD-10-CM | POA: Diagnosis present

## 2017-06-15 DIAGNOSIS — J189 Pneumonia, unspecified organism: Secondary | ICD-10-CM | POA: Diagnosis present

## 2017-06-15 DIAGNOSIS — D638 Anemia in other chronic diseases classified elsewhere: Secondary | ICD-10-CM | POA: Diagnosis present

## 2017-06-15 DIAGNOSIS — I739 Peripheral vascular disease, unspecified: Secondary | ICD-10-CM | POA: Diagnosis present

## 2017-06-15 DIAGNOSIS — J910 Malignant pleural effusion: Secondary | ICD-10-CM | POA: Diagnosis present

## 2017-06-15 DIAGNOSIS — E876 Hypokalemia: Secondary | ICD-10-CM | POA: Diagnosis present

## 2017-06-15 DIAGNOSIS — G9341 Metabolic encephalopathy: Secondary | ICD-10-CM | POA: Diagnosis present

## 2017-06-15 DIAGNOSIS — Z8249 Family history of ischemic heart disease and other diseases of the circulatory system: Secondary | ICD-10-CM

## 2017-06-15 DIAGNOSIS — R6520 Severe sepsis without septic shock: Secondary | ICD-10-CM | POA: Diagnosis present

## 2017-06-15 DIAGNOSIS — I10 Essential (primary) hypertension: Secondary | ICD-10-CM | POA: Diagnosis present

## 2017-06-15 DIAGNOSIS — J440 Chronic obstructive pulmonary disease with acute lower respiratory infection: Secondary | ICD-10-CM | POA: Diagnosis present

## 2017-06-15 DIAGNOSIS — Z951 Presence of aortocoronary bypass graft: Secondary | ICD-10-CM

## 2017-06-15 DIAGNOSIS — Z823 Family history of stroke: Secondary | ICD-10-CM

## 2017-06-15 LAB — CBC WITH DIFFERENTIAL/PLATELET
BASOS ABS: 0 10*3/uL (ref 0–0.1)
BASOS PCT: 0 %
EOS ABS: 0 10*3/uL (ref 0–0.7)
Eosinophils Relative: 0 %
HCT: 33.8 % — ABNORMAL LOW (ref 40.0–52.0)
HEMOGLOBIN: 10.8 g/dL — AB (ref 13.0–18.0)
LYMPHS ABS: 1 10*3/uL (ref 1.0–3.6)
Lymphocytes Relative: 7 %
MCH: 28.4 pg (ref 26.0–34.0)
MCHC: 31.9 g/dL — ABNORMAL LOW (ref 32.0–36.0)
MCV: 89.1 fL (ref 80.0–100.0)
Monocytes Absolute: 1.3 10*3/uL — ABNORMAL HIGH (ref 0.2–1.0)
Monocytes Relative: 9 %
NEUTROS PCT: 84 %
Neutro Abs: 12.3 10*3/uL — ABNORMAL HIGH (ref 1.4–6.5)
Platelets: 406 10*3/uL (ref 150–440)
RBC: 3.79 MIL/uL — AB (ref 4.40–5.90)
RDW: 17.7 % — ABNORMAL HIGH (ref 11.5–14.5)
WBC: 14.6 10*3/uL — AB (ref 3.8–10.6)

## 2017-06-15 LAB — COMPREHENSIVE METABOLIC PANEL
ALT: 133 U/L — AB (ref 17–63)
AST: 108 U/L — ABNORMAL HIGH (ref 15–41)
Albumin: 2.7 g/dL — ABNORMAL LOW (ref 3.5–5.0)
Alkaline Phosphatase: 173 U/L — ABNORMAL HIGH (ref 38–126)
Anion gap: 12 (ref 5–15)
BUN: 33 mg/dL — ABNORMAL HIGH (ref 6–20)
CHLORIDE: 107 mmol/L (ref 101–111)
CO2: 30 mmol/L (ref 22–32)
CREATININE: 1.23 mg/dL (ref 0.61–1.24)
Calcium: 8.7 mg/dL — ABNORMAL LOW (ref 8.9–10.3)
GFR, EST AFRICAN AMERICAN: 59 mL/min — AB (ref >60)
GFR, EST NON AFRICAN AMERICAN: 51 mL/min — AB (ref >60)
Glucose, Bld: 128 mg/dL — ABNORMAL HIGH (ref 65–99)
Potassium: 3.5 mmol/L (ref 3.5–5.1)
Sodium: 149 mmol/L — ABNORMAL HIGH (ref 135–145)
TOTAL PROTEIN: 7.1 g/dL (ref 6.5–8.1)
Total Bilirubin: 0.7 mg/dL (ref 0.3–1.2)

## 2017-06-15 LAB — INFLUENZA PANEL BY PCR (TYPE A & B)
INFLAPCR: NEGATIVE
INFLBPCR: NEGATIVE

## 2017-06-15 LAB — BLOOD GAS, VENOUS
ACID-BASE EXCESS: 10.2 mmol/L — AB (ref 0.0–2.0)
BICARBONATE: 35 mmol/L — AB (ref 20.0–28.0)
O2 Saturation: 90.5 %
PCO2 VEN: 47 mmHg (ref 44.0–60.0)
PH VEN: 7.48 — AB (ref 7.250–7.430)
PO2 VEN: 55 mmHg — AB (ref 32.0–45.0)
Patient temperature: 37

## 2017-06-15 LAB — LACTIC ACID, PLASMA: Lactic Acid, Venous: 1.6 mmol/L (ref 0.5–1.9)

## 2017-06-15 MED ORDER — LORATADINE 10 MG PO TABS
10.0000 mg | ORAL_TABLET | Freq: Every day | ORAL | Status: DC
  Administered 2017-06-16: 10 mg via ORAL
  Filled 2017-06-15 (×2): qty 1

## 2017-06-15 MED ORDER — DEXTROSE 5 % IV SOLN
1.0000 g | Freq: Three times a day (TID) | INTRAVENOUS | Status: DC
  Filled 2017-06-15 (×2): qty 1

## 2017-06-15 MED ORDER — SERTRALINE HCL 50 MG PO TABS
25.0000 mg | ORAL_TABLET | Freq: Every day | ORAL | Status: DC
  Administered 2017-06-16: 25 mg via ORAL
  Filled 2017-06-15 (×2): qty 1

## 2017-06-15 MED ORDER — FUROSEMIDE 40 MG PO TABS
40.0000 mg | ORAL_TABLET | Freq: Every day | ORAL | Status: DC
  Administered 2017-06-16: 40 mg via ORAL
  Filled 2017-06-15 (×2): qty 1

## 2017-06-15 MED ORDER — NYSTATIN 100000 UNIT/GM EX POWD
5.0000 g | Freq: Four times a day (QID) | CUTANEOUS | Status: DC | PRN
  Filled 2017-06-15: qty 15

## 2017-06-15 MED ORDER — SIMVASTATIN 20 MG PO TABS
40.0000 mg | ORAL_TABLET | Freq: Every day | ORAL | Status: DC
  Administered 2017-06-16: 40 mg via ORAL
  Filled 2017-06-15 (×2): qty 2

## 2017-06-15 MED ORDER — DEXTROSE 5 % IV SOLN
2.0000 g | Freq: Once | INTRAVENOUS | Status: AC
  Administered 2017-06-15: 2 g via INTRAVENOUS
  Filled 2017-06-15: qty 2

## 2017-06-15 MED ORDER — VANCOMYCIN HCL IN DEXTROSE 1-5 GM/200ML-% IV SOLN
1000.0000 mg | Freq: Once | INTRAVENOUS | Status: AC
Start: 2017-06-15 — End: 2017-06-15
  Administered 2017-06-15: 1000 mg via INTRAVENOUS
  Filled 2017-06-15: qty 200

## 2017-06-15 MED ORDER — VANCOMYCIN HCL 10 G IV SOLR
1250.0000 mg | INTRAVENOUS | Status: DC
  Administered 2017-06-16: 1250 mg via INTRAVENOUS
  Filled 2017-06-15 (×2): qty 1250

## 2017-06-15 MED ORDER — POTASSIUM CHLORIDE CRYS ER 10 MEQ PO TBCR
10.0000 meq | EXTENDED_RELEASE_TABLET | Freq: Every day | ORAL | Status: DC
  Administered 2017-06-16: 10 meq via ORAL
  Filled 2017-06-15 (×2): qty 1

## 2017-06-15 MED ORDER — IPRATROPIUM-ALBUTEROL 0.5-2.5 (3) MG/3ML IN SOLN
3.0000 mL | RESPIRATORY_TRACT | Status: DC | PRN
  Administered 2017-06-16 (×2): 3 mL via RESPIRATORY_TRACT
  Filled 2017-06-15 (×2): qty 3

## 2017-06-15 MED ORDER — CEFEPIME HCL 2 G IJ SOLR
2.0000 g | Freq: Three times a day (TID) | INTRAMUSCULAR | Status: DC
  Administered 2017-06-16 (×2): 2 g via INTRAVENOUS
  Filled 2017-06-15 (×3): qty 2

## 2017-06-15 MED ORDER — SODIUM CHLORIDE 0.9 % IV BOLUS (SEPSIS)
1000.0000 mL | Freq: Once | INTRAVENOUS | Status: AC
  Administered 2017-06-15: 1000 mL via INTRAVENOUS

## 2017-06-15 MED ORDER — OXYCODONE HCL 5 MG PO TABS
5.0000 mg | ORAL_TABLET | Freq: Four times a day (QID) | ORAL | Status: DC | PRN
  Administered 2017-06-15 – 2017-06-16 (×2): 5 mg via ORAL
  Filled 2017-06-15 (×2): qty 1

## 2017-06-15 MED ORDER — EPINEPHRINE PF 1 MG/10ML IJ SOSY: PREFILLED_SYRINGE | INTRAMUSCULAR | Status: AC | PRN

## 2017-06-15 MED ORDER — DEXTROSE IN LACTATED RINGERS 5 % IV SOLN
INTRAVENOUS | Status: DC
  Administered 2017-06-15: 22:00:00 via INTRAVENOUS

## 2017-06-15 NOTE — Progress Notes (Addendum)
ANTIBIOTIC CONSULT NOTE - INITIAL  Pharmacy Consult for Vancomycin , Cefepime Indication: pneumonia  No Known Allergies  Patient Measurements: Height: 5\' 9"  (175.3 cm) Weight: 180 lb (81.6 kg) IBW/kg (Calculated) : 70.7 Adjusted Body Weight: 75 kg   Vital Signs: Temp: 98.5 F (36.9 C) (12/22 2058) Temp Source: Oral (12/22 0109) BP: 110/68 (12/22 3235) Pulse Rate: 90 (12/22 2058) Intake/Output from previous day: No intake/output data recorded. Intake/Output from this shift: No intake/output data recorded.  Labs: Recent Labs    05/26/2017 1718  WBC 14.6*  HGB 10.8*  PLT 406  CREATININE 1.23   Estimated Creatinine Clearance: 43.1 mL/min (by C-G formula based on SCr of 1.23 mg/dL). No results for input(s): VANCOTROUGH, VANCOPEAK, VANCORANDOM, GENTTROUGH, GENTPEAK, GENTRANDOM, TOBRATROUGH, TOBRAPEAK, TOBRARND, AMIKACINPEAK, AMIKACINTROU, AMIKACIN in the last 72 hours.   Microbiology: No results found for this or any previous visit (from the past 720 hour(s)).  Medical History: Past Medical History:  Diagnosis Date  . CAD (coronary artery disease)   . Cancer ([place])   . Carotid stenosis   . Collagen vascular disease ([place])    cabg  . COPD (chronic obstructive pulmonary disease) ([place])   . Dementia   . Hyperlipidemia   . Hypertension   . PVD (peripheral vascular disease) ([place])   . Stroke [facility])     Medications:  Medications Prior to Admission  Medication Sig Dispense Refill Last Dose  . aspirin EC 81 MG tablet Take 81 mg by mouth daily.   06/10/2017 at Unknown time  . cetirizine (ZYRTEC) 10 MG tablet Take 10 mg by mouth daily.   06/24/2017 at Unknown time  . furosemide (LASIX) 40 MG tablet Take 40 mg by mouth daily.   06/14/2017 at Unknown time  . ipratropium-albuterol (DUONEB) 0.5-2.5 (3) MG/3ML SOLN Take 3 mLs by nebulization every 4 (four) hours as needed.   prn at prn  . Lactobacillus (ACIDOPHILUS PO) Take 1 capsule by mouth daily.   05/25/2017 at Unknown time  .  levofloxacin (LEVAQUIN) 500 MG tablet Take 500 mg by mouth daily.   05/31/2017 at Unknown time  . nystatin (NYSTATIN) powder Apply 5 g topically 4 (four) times daily.   prn at prn  . potassium chloride SA (KLOR-CON M15) 15 MEQ tablet Take 0.5 tablets (7.5 mEq total) by mouth daily. (Patient taking differently: Take 8 mEq by mouth daily. )   06/02/2017 at Unknown time  . sertraline (ZOLOFT) 25 MG tablet Take by mouth daily.    05/28/2017 at Unknown time  . simvastatin (ZOCOR) 40 MG tablet Take 40 mg by mouth daily.    05/30/2017 at Unknown time  . acetaminophen (TYLENOL) 500 MG tablet Take 1,000 mg by mouth daily.   prn at prn  . albuterol (PROVENTIL HFA;VENTOLIN HFA) 108 (90 Base) MCG/ACT inhaler Inhale 2 puffs into the lungs every 6 (six) hours as needed for wheezing or shortness of breath.   prn at prn  . oxyCODONE (ROXICODONE) 5 MG immediate release tablet Take 1 tablet (5 mg total) by mouth every 8 (eight) hours as needed. 12 tablet 0 prn at prn   Assessment: CrCl = 43.1 ml/min Ke = 0.04 hr-1 T1/2 = 17.3 hrs Vd = 57 L   Goal of Therapy:  Vancomycin trough level 15-20 mcg/ml  Plan:  Vancomycin 1 gm IV X 1 given on 12/22 @ 19:15. Vancomycin 1250 mg IV Q24H ordered to start on 12/23 @ 0100 , ~ 6 hrs after 1st dose (stacked dosing). This pt will  reach Css by 12/26 @ 0700. Will draw 1st trough on 12/26 @ 00:30, which will be close to Css.   Cefepime 2 gm IV Q8H to continue on 12/23 @ 0300.   Delmi Fulfer D 05/28/2017,8:59 PM

## 2017-06-15 NOTE — H&P (Signed)
[facility] [place] NAME: Charles Gallagher    MR#:  [id number]  DATE OF BIRTH:  [birth year]  DATE OF ADMISSION:  06/09/2017  PRIMARY CARE PHYSICIAN: Charles Aus, MD   REQUESTING/REFERRING PHYSICIAN:   CHIEF COMPLAINT:   Chief Complaint  Patient presents with  . Pneumonia    HISTORY OF PRESENT ILLNESS: Charles Gallagher  is a 83 y.o. male with a known history per below, from local nursing home, recently treated with Levaquin started 2 days ago for pneumonia, presents today with altered mental status, noted hypoxia, ER workup noted for large left pleural effusion, right hilar density for which CT of the chest was recommended, white count of 14,000, AST 108, ALT 133, sodium 149, patient evaluated in the emergency room, multiple family members at the bedside, patient is DNR status, patient is poor historian due to dementia, patient satting 91-95% on room air-noted mouth breathing, patient now been admitted for healthcare associated pneumonia and large left pleural effusion.  PAST MEDICAL HISTORY:   Past Medical History:  Diagnosis Date  . CAD (coronary artery disease)   . Cancer ([place])   . Carotid stenosis   . Collagen vascular disease ([place])    cabg  . COPD (chronic obstructive pulmonary disease) ([place])   . Dementia   . Hyperlipidemia   . Hypertension   . PVD (peripheral vascular disease) ([place])   . Stroke [facility])     PAST SURGICAL HISTORY:  Past Surgical History:  Procedure Laterality Date  . APPENDECTOMY    . CORONARY ARTERY BYPASS GRAFT    . LUNG BIOPSY    . SKIN BIOPSY      SOCIAL HISTORY:  Social History   Tobacco Use  . Smoking status: Current Some Day Smoker    Packs/day: 1.00    Years: 25.00    Pack years: 25.00    Types: Cigarettes  . Smokeless tobacco: Never Used  Substance Use Topics  . Alcohol use: No    FAMILY HISTORY:  Family History  Problem Relation Age of Onset  . Stroke Father   . CAD Sister   . CAD Brother      DRUG ALLERGIES: No Known Allergies  REVIEW OF SYSTEMS:  Unable to be obtained given dementia MEDICATIONS AT HOME:  Prior to Admission medications   Medication Sig Start Date End Date Taking? Authorizing Provider  aspirin EC 81 MG tablet Take 81 mg by mouth daily.   Yes [provider]  cetirizine (ZYRTEC) 10 MG tablet Take 10 mg by mouth daily.   Yes [provider]  furosemide (LASIX) 40 MG tablet Take 40 mg by mouth daily.   Yes [provider]  ipratropium-albuterol (DUONEB) 0.5-2.5 (3) MG/3ML SOLN Take 3 mLs by nebulization every 4 (four) hours as needed.   Yes [provider]  Lactobacillus (ACIDOPHILUS PO) Take 1 capsule by mouth daily.   Yes [provider]  levofloxacin (LEVAQUIN) 500 MG tablet Take 500 mg by mouth daily.   Yes [provider]  nystatin (NYSTATIN) powder Apply 5 g topically 4 (four) times daily.   Yes [provider]  potassium chloride SA (KLOR-CON M15) 15 MEQ tablet Take 0.5 tablets (7.5 mEq total) by mouth daily. Patient taking differently: Take 8 mEq by mouth daily.  10/29/16  Yes Mody, Ulice Bold, MD  sertraline (ZOLOFT) 25 MG tablet Take by mouth daily.    Yes [provider]  simvastatin (ZOCOR) 40 MG tablet Take 40  mg by mouth daily.    Yes [provider]  acetaminophen (TYLENOL) 500 MG tablet Take 1,000 mg by mouth daily.    [provider]  albuterol (PROVENTIL HFA;VENTOLIN HFA) 108 (90 Base) MCG/ACT inhaler Inhale 2 puffs into the lungs every 6 (six) hours as needed for wheezing or shortness of breath.    [provider]  oxyCODONE (ROXICODONE) 5 MG immediate release tablet Take 1 tablet (5 mg total) by mouth every 8 (eight) hours as needed. 10/29/16 10/29/17  Bettey Costa, MD      PHYSICAL EXAMINATION:   VITAL SIGNS: Blood pressure (!) 141/81, pulse 100, temperature 98.4 F (36.9 C), temperature source Axillary, resp. rate (!) 22, weight 81.6 kg (180 lb), SpO2  93 %.  GENERAL:  83 y.o.-year-old patient lying in the bed with no acute distress.  Frail-appearing, nontoxic appearing EYES: Pupils equal, round, reactive to light and accommodation. No scleral icterus. Extraocular muscles intact.  HEENT: Head atraumatic, normocephalic. Oropharynx and nasopharynx clear.  Noted gurgling with oral secretions NECK:  Supple, no jugular venous distention. No thyroid enlargement, no tenderness.  LUNGS: Severely diminished breath sounds on the left.  Minimal use of accessory muscles of respiration.  CARDIOVASCULAR: S1, S2 normal. No murmurs, rubs, or gallops.  ABDOMEN: Soft, nontender, nondistended. Bowel sounds present. No organomegaly or mass.  EXTREMITIES: No pedal edema, cyanosis, or clubbing.  NEUROLOGIC: Cranial nerves II through XII are intact. MAES. Gait not checked.  PSYCHIATRIC: The patient awake, alert, minimal verbal output-severe dementia at baseline   SKIN: No obvious rash, lesion, or ulcer.   LABORATORY PANEL:   CBC Recent Labs  Lab 06/22/2017 1718  WBC 14.6*  HGB 10.8*  HCT 33.8*  PLT 406  MCV 89.1  MCH 28.4  MCHC 31.9*  RDW 17.7*  LYMPHSABS 1.0  MONOABS 1.3*  EOSABS 0.0  BASOSABS 0.0   ------------------------------------------------------------------------------------------------------------------  Chemistries  Recent Labs  Lab 06/14/2017 1718  NA 149*  K 3.5  CL 107  CO2 30  GLUCOSE 128*  BUN 33*  CREATININE 1.23  CALCIUM 8.7*  AST 108*  ALT 133*  ALKPHOS 173*  BILITOT 0.7   ------------------------------------------------------------------------------------------------------------------ CrCl cannot be calculated (Unknown ideal weight.). ------------------------------------------------------------------------------------------------------------------ No results for input(s): TSH, T4TOTAL, T3FREE, THYROIDAB in the last 72 hours.  Invalid input(s): FREET3   Coagulation profile No results for input(s): INR, PROTIME  in the last 168 hours. ------------------------------------------------------------------------------------------------------------------- No results for input(s): DDIMER in the last 72 hours. -------------------------------------------------------------------------------------------------------------------  Cardiac Enzymes No results for input(s): CKMB, TROPONINI, MYOGLOBIN in the last 168 hours.  Invalid input(s): CK ------------------------------------------------------------------------------------------------------------------ Invalid input(s): POCBNP  ---------------------------------------------------------------------------------------------------------------  Urinalysis    Component Value Date/Time   COLORURINE YELLOW (A) 02/27/2016 1248   APPEARANCEUR CLEAR (A) 02/27/2016 1248   APPEARANCEUR Clear 09/24/2013 1421   LABSPEC 1.015 02/27/2016 1248   LABSPEC 1.019 09/24/2013 1421   PHURINE 5.0 02/27/2016 1248   GLUCOSEU NEGATIVE 02/27/2016 1248   GLUCOSEU Negative 09/24/2013 1421   HGBUR NEGATIVE 02/27/2016 1248   BILIRUBINUR NEGATIVE 02/27/2016 1248   BILIRUBINUR Negative 09/24/2013 1421   KETONESUR NEGATIVE 02/27/2016 1248   PROTEINUR NEGATIVE 02/27/2016 1248   NITRITE NEGATIVE 02/27/2016 1248   LEUKOCYTESUR NEGATIVE 02/27/2016 1248   LEUKOCYTESUR Negative 09/24/2013 1421     RADIOLOGY: Dg Chest Port 1 View  Result Date: 06/12/2017 CLINICAL DATA:  Recently diagnosed with pneumonia and placed on Levaquin since 06/05/2017. Tachypnea. EXAM: PORTABLE CHEST 1 VIEW COMPARISON:  09/24/2013 FINDINGS: Sternotomy wires unchanged. Examination demonstrates complete opacification of the  left hemithorax likely combination of pleural fluid and atelectasis. Prominence right hilar density. Right lung otherwise clear. Cardiac silhouette is mostly obscured. Remaining bones and soft tissues unchanged. IMPRESSION: Complete opacification left hemithorax likely combination of pleural fluid  and atelectasis. Prominence soft tissues density of the right hilum. Recommend contrast-enhanced chest CT to evaluate for underlying mass and adenopathy. Electronically Signed   By: Marin Olp M.D.   On: 06/18/2017 17:56    EKG: Orders placed or performed during the hospital encounter of 06/06/2017  . EKG 12-Lead  . EKG 12-Lead  . ED EKG 12-Lead  . ED EKG 12-Lead    IMPRESSION AND PLAN: 1 acute HCAP Admit to regular nursing floor bed on our pneumonia protocol, cefepime/vancomycin empirically for now, follow-up on cultures, supplemental oxygen as needed, aspiration precautions  2 acute large left pleural effusion Most likely secondary to above IR consulted for thoracentesis in the morning, continuous pulse oximetry, antibiotics per above, supplemental oxygen as needed-patient is mouth breather, aspiration precautions/head of the bed at 45 degrees at all times, suctioning as needed, respiratory therapy consulted to evaluate/treat  3 chronic dementia, severe Increase nursing care as needed, aspiration/fall/skin care precautions  4 COPD without exacerbation Breathing treatments as needed  5 history of peptic ulcer disease PPI daily  DNR status per family Condition stable Prognosis poor DVT prophylaxis with SCDs given planned procedure Disposition back to nursing facility in 1-3 days barring any complications     All the records are reviewed and case discussed with ED provider. Management plans discussed with the patient, family and they are in agreement.  CODE STATUS: Code Status History    Date Active Date Inactive Code Status Order ID Comments User Context   10/26/2016 21:45 10/29/2016 18:29 Full Code [id number]  Demetrios Loll, MD Inpatient    Advance Directive Documentation     Most Recent Value  Type of Advance Directive  Out of facility DNR (pink MOST or yellow form)  Pre-existing out of facility DNR order (yellow form or pink MOST form)  Yellow form placed in chart (order  not valid for inpatient use)  "MOST" Form in Place?  No data       TOTAL TIME TAKING CARE OF THIS PATIENT: 45 minutes.    Avel Peace Istvan Behar M.D on 06/06/2017   Between 7am to 6pm - Pager - [phone]  After 6pm go to [email or web address] - password [email or web address] [facility]  Office  [phone]  CC: Primary care physician; Charles Aus, MD   Note: This dictation was prepared with Dragon dictation along with smaller phrase technology. Any transcriptional errors that result from this process are unintentional.

## 2017-06-15 NOTE — ED Triage Notes (Addendum)
Pt brought in from [facility] because of diminishing health. Pt was recently dx with pneumonia and placed on Levaquin. Pt has been on it since 06/13/2017. Pt has audible rattling in his airway from secretions. Pt is breathing rapidly and using accessory muscles. Pt's O2 saturation is WNL on current O2

## 2017-06-15 NOTE — ED Notes (Addendum)
Dr. Reita Cliche is aware of pt at this time and respiratory notified of venous blood gas.

## 2017-06-15 NOTE — Progress Notes (Signed)
Rt assessed pt: pt has strong productive cough

## 2017-06-15 NOTE — ED Provider Notes (Signed)
[facility] Emergency Department Provider Note ____________________________________________   I have reviewed the triage vital signs and the triage nursing note.  HISTORY  Chief Complaint Pneumonia   Historian Level 5 caveat, unable to obtain due to patient dementia and critical illness. History per daughter as well as wife, and nursing home report  HPI Charles Gallagher is a 83 y.o. male from nursing home, has been on antibiotic Levaquin for several days due to upper respiratory congestion and diagnosis in wife states patient was able to participate with eating this morning, but this afternoon they were called that the oxygen level was dropping and the patient was less responsive.  He does have coronary disease, CABG, and COPD as well as dementia.     Past Medical History:  Diagnosis Date  . CAD (coronary artery disease)   . Cancer ([place])   . Carotid stenosis   . Collagen vascular disease ([place])    cabg  . COPD (chronic obstructive pulmonary disease) ([place])   . Dementia   . Hyperlipidemia   . Hypertension   . PVD (peripheral vascular disease) ([place])   . Stroke [facility])     Patient Active Problem List   Diagnosis Date Noted  . Cellulitis of left foot 10/26/2016    Past Surgical History:  Procedure Laterality Date  . APPENDECTOMY    . CORONARY ARTERY BYPASS GRAFT    . LUNG BIOPSY    . SKIN BIOPSY      Prior to Admission medications   Medication Sig Start Date End Date Taking? Authorizing Provider  acetaminophen (TYLENOL) 500 MG tablet Take 1,000 mg by mouth daily.    [provider]  albuterol (PROVENTIL HFA;VENTOLIN HFA) 108 (90 Base) MCG/ACT inhaler Inhale 2 puffs into the lungs every 6 (six) hours as needed for wheezing or shortness of breath.    [provider]  aspirin EC 81 MG tablet Take 81 mg by mouth daily.    [provider]  furosemide (LASIX) 40 MG tablet Take 40 mg by mouth daily.    [provider]   ondansetron (ZOFRAN ODT) 4 MG disintegrating tablet Take 1 tablet (4 mg total) by mouth every 6 (six) hours as needed for nausea or vomiting. Patient not taking: Reported on 10/26/2016 11/07/14   [name], MD  oxyCODONE (ROXICODONE) 5 MG immediate release tablet Take 1 tablet (5 mg total) by mouth every 8 (eight) hours as needed. 10/29/16 10/29/17  Bettey Costa, MD  potassium chloride SA (KLOR-CON M15) 15 MEQ tablet Take 0.5 tablets (7.5 mEq total) by mouth daily. 10/29/16   Bettey Costa, MD  predniSONE (DELTASONE) 50 MG tablet Take 1 tablet daily for 3 days 10/29/16   Bettey Costa, MD  senna (SENOKOT) 8.6 MG TABS tablet Take 1 tablet (8.6 mg total) by mouth daily. Patient not taking: Reported on 10/26/2016 02/27/16   [name], MD  sertraline (ZOLOFT) 50 MG tablet Take 50 mg by mouth daily.    [provider]  simvastatin (ZOCOR) 40 MG tablet Take 40 mg by mouth daily.     [provider]  tiotropium (SPIRIVA) 18 MCG inhalation capsule Place 18 mcg into inhaler and inhale 2 (two) times daily.    [provider]  traMADol (ULTRAM) 50 MG tablet Take 2 tablets (100 mg total) by mouth 2 (two) times daily. 10/29/16   Henreitta Leber, MD    No Known Allergies  Family History  Problem Relation Age of Onset  . Stroke  Father   . CAD Sister   . CAD Brother     Social History Social History   Tobacco Use  . Smoking status: Current Some Day Smoker    Packs/day: 1.00    Years: 25.00    Pack years: 25.00    Types: Cigarettes  . Smokeless tobacco: Never Used  Substance Use Topics  . Alcohol use: No  . Drug use: No    Review of Systems  Limited due to dementia and critical illness.  Recent diagnosis of pneumonia, decreased/altered mental status Rhonchorous breathing.  ____________________________________________   PHYSICAL EXAM:  VITAL SIGNS: ED Triage Vitals  Enc Vitals Group     BP 06/17/2017 1716 (!) 137/92     Pulse Rate 06/10/2017 1712 (!) 104     Resp  05/29/2017 1716 (!) 25     Temp 06/20/2017 1712 98.4 F (36.9 C)     Temp Source 06/05/2017 1712 Axillary     SpO2 06/16/2017 1708 (!) 89 %     Weight 06/22/2017 1714 180 lb (81.6 kg)     Height --      Head Circumference --      Peak Flow --      Pain Score --      Pain Loc --      Pain Edu? --      Excl. in [place]? --      Constitutional: Alert at times, not really able to follow commands.  HEENT   Head: Normocephalic and atraumatic.      Eyes: Conjunctivae are normal. Pupils equal and round.       Ears:         Nose: No congestion/rhinnorhea.   Mouth/Throat: Mucous membranes are dry.   Neck: No stridor. Cardiovascular/Chest: Cardiac rate, regular rhythm.  No murmurs, rubs, or gallops. Respiratory: Tachypnea with rhonchi throughout.  Decreased breath sounds especially on the left side. Gastrointestinal: Soft. No distention, no guarding, no rebound. Nontender.    Genitourinary/rectal:Deferred Musculoskeletal: Nontender with normal range of motion in all extremities. No joint effusions.  No lower extremity tenderness.  No edema. Neurologic: No facial droop.  Not talking.  Moves 4 extremities.  Not following commands.   Skin:  Skin is warm, dry and intact. No rash noted.   ____________________________________________  LABS (pertinent positives/negatives) I, Lisa Roca, MD the attending physician have reviewed the labs noted below.  Labs Reviewed  COMPREHENSIVE METABOLIC PANEL - Abnormal; Notable for the following components:      Result Value   Sodium 149 (*)    Glucose, Bld 128 (*)    BUN 33 (*)    Calcium 8.7 (*)    Albumin 2.7 (*)    AST 108 (*)    ALT 133 (*)    Alkaline Phosphatase 173 (*)    GFR calc non Af Amer 51 (*)    GFR calc Af Amer 59 (*)    All other components within normal limits  CBC WITH DIFFERENTIAL/PLATELET - Abnormal; Notable for the following components:   WBC 14.6 (*)    RBC 3.79 (*)    Hemoglobin 10.8 (*)    HCT 33.8 (*)    MCHC 31.9 (*)     RDW 17.7 (*)    Neutro Abs 12.3 (*)    Monocytes Absolute 1.3 (*)    All other components within normal limits  BLOOD GAS, VENOUS - Abnormal; Notable for the following components:   pH, Ven 7.48 (*)    pO2, Ven  55.0 (*)    Bicarbonate 35.0 (*)    Acid-Base Excess 10.2 (*)    All other components within normal limits  CULTURE, BLOOD (ROUTINE X 2)  CULTURE, BLOOD (ROUTINE X 2)  LACTIC ACID, PLASMA  URINALYSIS, ROUTINE W REFLEX MICROSCOPIC  INFLUENZA PANEL BY PCR (TYPE A & B)    ____________________________________________    EKG I, Lisa Roca, MD, the attending physician have personally viewed and interpreted all ECGs.  103 bpm.  Sinus tachycardia.  Right bundle branch block.  Nonspecific ST and T wave ____________________________________________  RADIOLOGY All Xrays were viewed by me.  Imaging interpreted by Radiologist, and I, Lisa Roca, MD the attending physician have reviewed the radiologist interpretation noted below.  Chest x-ray two-view:  IMPRESSION: Complete opacification left hemithorax likely combination of pleural fluid and atelectasis. Prominence soft tissues density of the right hilum. Recommend contrast-enhanced chest CT to evaluate for underlying mass and adenopathy. __________________________________________  PROCEDURES  Procedure(s) performed: None  Critical Care performed: CRITICAL CARE Performed by: Lisa Roca   Total critical care time: 30 minutes  Critical care time was exclusive of separately billable procedures and treating other patients.  Critical care was necessary to treat or prevent imminent or life-threatening deterioration.  Critical care was time spent personally by me on the following activities: development of treatment plan with patient and/or surrogate as well as nursing, discussions with consultants, evaluation of patient's response to treatment, examination of patient, obtaining history from patient or surrogate,  ordering and performing treatments and interventions, ordering and review of laboratory studies, ordering and review of radiographic studies, pulse oximetry and re-evaluation of patient's condition.    ____________________________________________  ED COURSE / ASSESSMENT AND PLAN  Pertinent labs & imaging results that were available during my care of the patient were reviewed by me and considered in my medical decision making (see chart for details).    Patient with hypoxia, requiring 6 L nasal cannula to hold 95% O2 sat.  He does have decreased mental status and is not really responding that well although he is protecting his airway right now.  Review of x-ray, left complete lung opacification.  Patient broadened on antibiotics for sepsis due to pneumonia.  Reviewed with family patient does have a DNR, they are not ready to make a decision whether not if patient needed intubation for pneumonia if they would be okay with that.  Lactate okay, no hypotension, will start with 1 L normal saline given the tachycardia.  DIFFERENTIAL DIAGNOSIS: Including but not limited to sepsis, pneumonia, urinary tract infection, electrolyte disturbance, dehydration, kidney failure, etc.  CONSULTATIONS:   Hospitalist for admission.   Patient / Family / Caregiver informed of clinical course, medical decision-making process, and agree with plan.   ___________________________________________   FINAL CLINICAL IMPRESSION(S) / ED DIAGNOSES   Final diagnoses:  Pneumonia of left lung due to infectious organism, unspecified part of lung  Sepsis, due to unspecified organism ([place])  Hypoxia      ___________________________________________        Note: This dictation was prepared with Dragon dictation. Any transcriptional errors that result from this process are unintentional    Lisa Roca, MD 05/31/2017 [id number]

## 2017-06-16 ENCOUNTER — Inpatient Hospital Stay: Payer: Medicare Other

## 2017-06-16 ENCOUNTER — Encounter: Payer: Self-pay | Admitting: Radiology

## 2017-06-16 ENCOUNTER — Other Ambulatory Visit: Payer: Self-pay

## 2017-06-16 DIAGNOSIS — L8990 Pressure ulcer of unspecified site, unspecified stage: Secondary | ICD-10-CM

## 2017-06-16 LAB — BODY FLUID CELL COUNT WITH DIFFERENTIAL
EOS FL: 1 %
Lymphs, Fluid: 74 %
Monocyte-Macrophage-Serous Fluid: 0 %
NEUTROPHIL FLUID: 25 %
Other Cells, Fluid: 0 %
Total Nucleated Cell Count, Fluid: 2129 cu mm

## 2017-06-16 LAB — AMYLASE, PLEURAL OR PERITONEAL FLUID: AMYLASE FL: 60 U/L

## 2017-06-16 LAB — PROTEIN, PLEURAL OR PERITONEAL FLUID: Total protein, fluid: 4.2 g/dL

## 2017-06-16 LAB — LACTATE DEHYDROGENASE, PLEURAL OR PERITONEAL FLUID: LD FL: 328 U/L — AB (ref 3–23)

## 2017-06-16 LAB — GLUCOSE, PLEURAL OR PERITONEAL FLUID: GLUCOSE FL: 90 mg/dL

## 2017-06-16 LAB — MRSA PCR SCREENING: MRSA BY PCR: NEGATIVE

## 2017-06-16 LAB — ALBUMIN, PLEURAL OR PERITONEAL FLUID: Albumin, Fluid: 2.4 g/dL

## 2017-06-16 MED ORDER — ACETYLCYSTEINE 10 % IN SOLN
4.0000 mL | Freq: Three times a day (TID) | RESPIRATORY_TRACT | Status: DC
  Filled 2017-06-16: qty 4

## 2017-06-16 MED ORDER — IPRATROPIUM-ALBUTEROL 0.5-2.5 (3) MG/3ML IN SOLN
3.0000 mL | RESPIRATORY_TRACT | Status: DC
  Administered 2017-06-16 – 2017-06-17 (×4): 3 mL via RESPIRATORY_TRACT
  Filled 2017-06-16 (×3): qty 3

## 2017-06-16 MED ORDER — MORPHINE SULFATE (CONCENTRATE) 10 MG/0.5ML PO SOLN
2.5000 mg | ORAL | Status: DC | PRN
  Administered 2017-06-17 (×2): 2.6 mg via ORAL
  Filled 2017-06-16 (×2): qty 1

## 2017-06-16 MED ORDER — IOPAMIDOL (ISOVUE-300) INJECTION 61%
60.0000 mL | Freq: Once | INTRAVENOUS | Status: AC | PRN
  Administered 2017-06-16: 60 mL via INTRAVENOUS

## 2017-06-16 MED ORDER — SODIUM CHLORIDE 0.9 % IV SOLN
3.0000 g | Freq: Four times a day (QID) | INTRAVENOUS | Status: DC
  Administered 2017-06-16 – 2017-06-17 (×4): 3 g via INTRAVENOUS
  Filled 2017-06-16 (×8): qty 3

## 2017-06-16 MED ORDER — ACETYLCYSTEINE 20 % IN SOLN
2.0000 mL | Freq: Three times a day (TID) | RESPIRATORY_TRACT | Status: DC
  Administered 2017-06-16: 4 mL via RESPIRATORY_TRACT
  Administered 2017-06-16 – 2017-06-17 (×2): 2 mL via RESPIRATORY_TRACT
  Filled 2017-06-16 (×3): qty 4

## 2017-06-16 MED ORDER — DEXTROSE 5 % IV SOLN
2.0000 g | Freq: Two times a day (BID) | INTRAVENOUS | Status: DC
  Filled 2017-06-16: qty 2

## 2017-06-16 NOTE — Progress Notes (Signed)
   06/16/17 1300  Clinical Encounter Type  Visited With Patient and family together  Visit Type Initial;Spiritual support  Referral From Nurse;Family  Spiritual Encounters  Spiritual Needs Prayer;Emotional  Request for prayer by family; Patient has dementia; [place] offered spiritual and emotional support along with prayer

## 2017-06-16 NOTE — Procedures (Signed)
PROCEDURE SUMMARY:  Successful US guided left thoracentesis. Yielded 1 L of bloody pleural fluid. Pt tolerated procedure well. No immediate complications.  Specimen was sent for labs. CXR ordered.  Ascencion Dike PA-C 06/16/2017 2:01 PM

## 2017-06-16 NOTE — Progress Notes (Signed)
ANTIBIOTIC CONSULT NOTE - INITIAL  Pharmacy Consult for Vancomycin , Cefepime Indication: pneumonia  No Known Allergies  Patient Measurements: Height: 5\' 9"  (175.3 cm) Weight: 180 lb (81.6 kg) IBW/kg (Calculated) : 70.7 Adjusted Body Weight: 75 kg   Vital Signs: Temp: 100.3 F (37.9 C) (12/23 0411) Temp Source: Oral (12/23 0411) BP: 120/55 (12/23 0411) Pulse Rate: 86 (12/23 0411) Intake/Output from previous day: 12/22 0701 - 12/23 0700 In: 700 [I.V.:450; IV Piggyback:250] Out: 0  Intake/Output from this shift: No intake/output data recorded.  Labs: Recent Labs    05/29/2017 1718  WBC 14.6*  HGB 10.8*  PLT 406  CREATININE 1.23   Estimated Creatinine Clearance: 43.1 mL/min (by C-G formula based on SCr of 1.23 mg/dL). No results for input(s): VANCOTROUGH, VANCOPEAK, VANCORANDOM, GENTTROUGH, GENTPEAK, GENTRANDOM, TOBRATROUGH, TOBRAPEAK, TOBRARND, AMIKACINPEAK, AMIKACINTROU, AMIKACIN in the last 72 hours.   Microbiology: Recent Results (from the past 720 hour(s))  Blood Culture (routine x 2)     Status: None (Preliminary result)   Collection Time: 06/17/2017  5:18 PM  Result Value Ref Range Status   Specimen Description BLOOD RT FOREARM  Final   Special Requests   Final    BOTTLES DRAWN AEROBIC AND ANAEROBIC Blood Culture adequate volume   Culture   Final    NO GROWTH < 24 HOURS Performed at [facility], [address], [place], [place] [address]    Report Status PENDING  Incomplete  Blood Culture (routine x 2)     Status: None (Preliminary result)   Collection Time: 06/14/2017  5:23 PM  Result Value Ref Range Status   Specimen Description BLOOD LT WRIST  Final   Special Requests   Final    BOTTLES DRAWN AEROBIC AND ANAEROBIC Blood Culture adequate volume   Culture   Final    NO GROWTH < 24 HOURS Performed at [facility], [address] [place], [place] [address]    Report Status PENDING  Incomplete  MRSA PCR Screening     Status: None   Collection Time: 06/16/17  4:35 AM  Result Value Ref Range Status   MRSA by PCR NEGATIVE NEGATIVE Final    Comment:        The GeneXpert MRSA Assay (FDA approved for NASAL specimens only), is one component of a comprehensive MRSA colonization surveillance program. It is not intended to diagnose MRSA infection nor to guide or monitor treatment for MRSA infections. Performed at [facility], [address] [place], [place] [address]     Assessment: Vancomycin and cefepime for PNA.  Goal of Therapy:  Vancomycin trough level 15-20 mcg/ml  Plan:  Change cefepime to 2 g IV q12h Continue vancomycin 1250 mg IV q24h with VT ordered on 12/25  MRSA PCR negative, recommend d/c vancomycin  Lenis Noon, PharmD, BCPS Clinical Pharmacist 06/16/2017,11:41 AM

## 2017-06-16 NOTE — Progress Notes (Signed)
Initial Nutrition Assessment  DOCUMENTATION CODES:   Not applicable  INTERVENTION:  Provide Magic cup TID with meals, each supplement provides 290 kcal and 9 grams of protein.  Consider SLP consult if patient is having any difficulty with swallowing in setting of respiratory status.  NUTRITION DIAGNOSIS:   Increased nutrient needs related to catabolic illness(COPD) as evidenced by estimated needs.  GOAL:   Patient will meet greater than or equal to 90% of their needs  MONITOR:   PO intake, Supplement acceptance, Diet advancement, Labs, Weight trends, Skin, I & O's  REASON FOR ASSESSMENT:   Malnutrition Screening Tool    ASSESSMENT:   83 year old male with PMHx of COPD, hx CVA, dementia, HLD, hx CABG, CAD admitted with left post-obstructive PNA with worsening acute on chronic respiratory failure, left pleural effusion, acute toxic metabolic encephalopathy over dementia.   -Patient s/p US-guided thoracentesis today which yielded 1 L of bloody pleural fluid.  Discussed with RN. Patient currently NPO but RN is paging MD about starting diet. Breathing has worsened following thoracentesis and she is paging respiratory. Per chart patient's family does not want aggressive bronchoscopy or ventilatory support. Possible neoplastic process blocking left main bronchus. Oral morphine was added today for air hunger. Patient will be a candidate for hospice services/hospice home at discharge.  Medications reviewed and include: Lasix 40 mg daily, potassium chloride 10 mEq daily, sertraline, Unasyn.  Labs reviewed: Sodium 149, BUN 33.  NUTRITION - FOCUSED PHYSICAL EXAM:  Not appropriate to complete NFPE at this time as patient is having worsening respiratory failure.  Diet Order:  DIET DYS 3 Room service appropriate? Yes; Fluid consistency: Thin  EDUCATION NEEDS:   Not appropriate for education at this time  Skin:  Skin Assessment: Skin Integrity Issues: Skin Integrity Issues:: Other  (Comment) Other: DTI to sacrum  Last BM:  Unknown  Height:   Ht Readings from Last 1 Encounters:  06/10/2017 5\' 9"  (1.753 m)    Weight:   Wt Readings from Last 1 Encounters:  06/23/2017 180 lb (81.6 kg)    Ideal Body Weight:  72.7 kg  BMI:  Body mass index is 26.58 kg/m.  Estimated Nutritional Needs:   Kcal:  1790-2090 (MSJ x 1.2-1.4)  Protein:  80-100 grams (1-1.2 grams/kg)  Fluid:  2 L/day (25 mL/kg)  Willey Blade, MS, RD, LDN Office: [phone] Pager: [phone] After Hours/Weekend Pager: [phone]

## 2017-06-16 NOTE — Progress Notes (Signed)
ANTIBIOTIC CONSULT NOTE - INITIAL  Pharmacy Consult for Unasyn Indication: obstructive pneumonia  No Known Allergies  Patient Measurements: Height: 5\' 9"  (175.3 cm) Weight: 180 lb (81.6 kg) IBW/kg (Calculated) : 70.7 Adjusted Body Weight:   Vital Signs: Temp: 100.3 F (37.9 C) (12/23 0411) Temp Source: Oral (12/23 0411) BP: 120/55 (12/23 0411) Pulse Rate: 86 (12/23 0411) Intake/Output from previous day: 12/22 0701 - 12/23 0700 In: 700 [I.V.:450; IV Piggyback:250] Out: 0  Intake/Output from this shift: No intake/output data recorded.  Labs: Recent Labs    06/09/2017 1718  WBC 14.6*  HGB 10.8*  PLT 406  CREATININE 1.23   Estimated Creatinine Clearance: 43.1 mL/min (by C-G formula based on SCr of 1.23 mg/dL). No results for input(s): VANCOTROUGH, VANCOPEAK, VANCORANDOM, GENTTROUGH, GENTPEAK, GENTRANDOM, TOBRATROUGH, TOBRAPEAK, TOBRARND, AMIKACINPEAK, AMIKACINTROU, AMIKACIN in the last 72 hours.   Microbiology: Recent Results (from the past 720 hour(s))  Blood Culture (routine x 2)     Status: None (Preliminary result)   Collection Time: 06/20/2017  5:18 PM  Result Value Ref Range Status   Specimen Description BLOOD RT FOREARM  Final   Special Requests   Final    BOTTLES DRAWN AEROBIC AND ANAEROBIC Blood Culture adequate volume   Culture   Final    NO GROWTH < 24 HOURS Performed at [facility], [address] [place], [place] [address]    Report Status PENDING  Incomplete  Blood Culture (routine x 2)     Status: None (Preliminary result)   Collection Time: 06/06/2017  5:23 PM  Result Value Ref Range Status   Specimen Description BLOOD LT WRIST  Final   Special Requests   Final    BOTTLES DRAWN AEROBIC AND ANAEROBIC Blood Culture adequate volume   Culture   Final    NO GROWTH < 24 HOURS Performed at [facility], [address], [place], [place] [address]    Report Status PENDING  Incomplete  MRSA PCR Screening     Status: None   Collection  Time: 06/16/17  4:35 AM  Result Value Ref Range Status   MRSA by PCR NEGATIVE NEGATIVE Final    Comment:        The GeneXpert MRSA Assay (FDA approved for NASAL specimens only), is one component of a comprehensive MRSA colonization surveillance program. It is not intended to diagnose MRSA infection nor to guide or monitor treatment for MRSA infections. Performed at [facility], [address], [place], [place] [address]     Medical History: Past Medical History:  Diagnosis Date  . CAD (coronary artery disease)   . Cancer ([place])   . Carotid stenosis   . Collagen vascular disease ([place])    cabg  . COPD (chronic obstructive pulmonary disease) ([place])   . Dementia   . Hyperlipidemia   . Hypertension   . PVD (peripheral vascular disease) ([place])   . Stroke [facility])     Medications:  Medications Prior to Admission  Medication Sig Dispense Refill Last Dose  . aspirin EC 81 MG tablet Take 81 mg by mouth daily.   06/21/2017 at Unknown time  . cetirizine (ZYRTEC) 10 MG tablet Take 10 mg by mouth daily.   05/27/2017 at Unknown time  . furosemide (LASIX) 40 MG tablet Take 40 mg by mouth daily.   06/20/2017 at Unknown time  . ipratropium-albuterol (DUONEB) 0.5-2.5 (3) MG/3ML SOLN Take 3 mLs by nebulization every 4 (four) hours as needed.   prn at prn  .  Lactobacillus (ACIDOPHILUS PO) Take 1 capsule by mouth daily.   05/31/2017 at Unknown time  . levofloxacin (LEVAQUIN) 500 MG tablet Take 500 mg by mouth daily.   06/02/2017 at Unknown time  . nystatin (NYSTATIN) powder Apply 5 g topically 4 (four) times daily.   prn at prn  . potassium chloride SA (KLOR-CON M15) 15 MEQ tablet Take 0.5 tablets (7.5 mEq total) by mouth daily. (Patient taking differently: Take 8 mEq by mouth daily. )   06/01/2017 at Unknown time  . sertraline (ZOLOFT) 25 MG tablet Take by mouth daily.    05/26/2017 at Unknown time  . simvastatin (ZOCOR) 40 MG tablet Take 40 mg by mouth daily.    05/28/2017 at Unknown  time  . acetaminophen (TYLENOL) 500 MG tablet Take 1,000 mg by mouth daily.   prn at prn  . albuterol (PROVENTIL HFA;VENTOLIN HFA) 108 (90 Base) MCG/ACT inhaler Inhale 2 puffs into the lungs every 6 (six) hours as needed for wheezing or shortness of breath.   prn at prn  . oxyCODONE (ROXICODONE) 5 MG immediate release tablet Take 1 tablet (5 mg total) by mouth every 8 (eight) hours as needed. 12 tablet 0 prn at prn   Scheduled:  . acetylcysteine  2 mL Nebulization TID  . furosemide  40 mg Oral Daily  . ipratropium-albuterol  3 mL Nebulization Q4H  . loratadine  10 mg Oral Daily  . potassium chloride  10 mEq Oral Daily  . sertraline  25 mg Oral Daily  . simvastatin  40 mg Oral Daily   Assessment: Pharmacy consulted to dose and monitor Unasyn in this 83 year old being treated for obstructive pneumonia.  Goal of Therapy:    Plan:  Will start Unasyn 3 g IV q6 hours.   Tory Septer D 06/16/2017,1:25 PM

## 2017-06-16 NOTE — Progress Notes (Signed)
[place] [facility] [place] NAME: Charles Gallagher    MR#:  [id number]  DATE OF BIRTH:  10-25-1928  SUBJECTIVE:  CHIEF COMPLAINT:   Chief Complaint  Patient presents with  . Pneumonia   Worsening resp status. Patient is drowzy.  Confused Decreased hearing  REVIEW OF SYSTEMS:    Review of Systems  Unable to perform ROS: Dementia    DRUG ALLERGIES:  No Known Allergies  VITALS:  Blood pressure (!) 120/55, pulse 86, temperature 100.3 F (37.9 C), temperature source Oral, resp. rate 18, height 5\' 9"  (1.753 m), weight 81.6 kg (180 lb), SpO2 97 %.  PHYSICAL EXAMINATION:   Physical Exam  GENERAL:  83 y.o.-year-old patient lying in the bed with resp distress EYES:  No scleral icterus. Extraocular muscles intact.  HEENT: Head atraumatic, normocephalic. Oropharynx and nasopharynx clear.  NECK:  Supple, no jugular venous distention. No thyroid enlargement. LUNGS: Bilateral coarse breath sounds. Decreased air entry left CARDIOVASCULAR: S1, S2 normal. No murmurs, rubs, or gallops.  ABDOMEN: Soft, nontender, nondistended. Bowel sounds present. No organomegaly or mass.  EXTREMITIES: No cyanosis, clubbing or edema b/l.    PSYCHIATRIC: The patient is drowzy. confused SKIN: No obvious rash, lesion, or ulcer.   LABORATORY PANEL:   CBC Recent Labs  Lab 06/14/2017 1718  WBC 14.6*  HGB 10.8*  HCT 33.8*  PLT 406   ------------------------------------------------------------------------------------------------------------------ Chemistries  Recent Labs  Lab 06/14/2017 1718  NA 149*  K 3.5  CL 107  CO2 30  GLUCOSE 128*  BUN 33*  CREATININE 1.23  CALCIUM 8.7*  AST 108*  ALT 133*  ALKPHOS 173*  BILITOT 0.7   ------------------------------------------------------------------------------------------------------------------  Cardiac Enzymes No results for input(s): TROPONINI in the last 168  hours. ------------------------------------------------------------------------------------------------------------------  RADIOLOGY:  Ct Chest W Contrast  Result Date: 06/16/2017 CLINICAL DATA:  Dema Severin out of left chest by chest x-ray. Recent pneumonia. EXAM: CT CHEST WITH CONTRAST TECHNIQUE: Multidetector CT imaging of the chest was performed during intravenous contrast administration. CONTRAST:  94mL ISOVUE-300 IOPAMIDOL (ISOVUE-300) INJECTION 61% COMPARISON:  Chest x-ray on 06/07/2017 FINDINGS: Cardiovascular: The heart size is within normal limits. The thoracic aorta is of normal caliber and demonstrates calcified plaque. There is evidence of prior CABG. No pericardial fluid. Central pulmonary arteries are normal in caliber. Mediastinum/Nodes: No evidence of mediastinal masses. Borderline to mildly enlarged mediastinal lymph nodes present with right lower paratracheal lymph node measuring 1.3 cm and subcarinal lymph node measuring 1.5 cm. Mildly prominent right hilar lymph node measures 1.1 cm. Lungs/Pleura: There is a large left pleural effusion and complete collapse and atelectasis of the left lung with no aerated lung present. The left mainstem bronchus is occluded by material representing either mucous or tumor. No obvious pleural masses or evidence pleural enhancement/thickening to suggest empyema. Pleural fluid density is low. There is some calcification in the anterior and lateral left lung. There is some emphysematous disease in the upper right lung. Opacity in the posterior right upper lobe may be chronic as it appears to be associated with some mild bronchiectasis. There is some scattered scarring throughout the right lung. Small right lower lobe nodule towards the posterior lung base measures 0.8 cm. Upper Abdomen: Calcified granuloma present in the spleen. No adrenal masses. Musculoskeletal: No chest wall abnormality. No acute or significant osseous findings. IMPRESSION: 1. Complete  atelectasis of the left lung with occlusion of the left mainstem bronchus by either mucous or tumor. Pulmonology consultation recommended. 2. Large left pleural effusion.  No pleural masses or evidence of pleural enhancement. 3. Mildly enlarged right lower paratracheal and subcarinal lymph nodes. Neoplastic process cannot be excluded. There also is a mildly prominent right hilar lymph node. 4. Chronic emphysematous lung disease in the right lung with some focal bronchiectasis in the posterior right upper lobe. 5. 8 mm nodule at the posterior right lung base. Emphysema (ICD10-J43.9). Electronically Signed   By: Aletta Edouard M.D.   On: 06/16/2017 09:25   Dg Chest Port 1 View  Result Date: 06/14/2017 CLINICAL DATA:  Recently diagnosed with pneumonia and placed on Levaquin since 06/05/2017. Tachypnea. EXAM: PORTABLE CHEST 1 VIEW COMPARISON:  09/24/2013 FINDINGS: Sternotomy wires unchanged. Examination demonstrates complete opacification of the left hemithorax likely combination of pleural fluid and atelectasis. Prominence right hilar density. Right lung otherwise clear. Cardiac silhouette is mostly obscured. Remaining bones and soft tissues unchanged. IMPRESSION: Complete opacification left hemithorax likely combination of pleural fluid and atelectasis. Prominence soft tissues density of the right hilum. Recommend contrast-enhanced chest CT to evaluate for underlying mass and adenopathy. Electronically Signed   By: Marin Olp M.D.   On: 05/31/2017 17:56   ASSESSMENT AND PLAN:   * Left Post obstructive pneumonia with acute on chronic respiratory failure-worsening Mucous plug versus mass in left bronchus. Stop vancomycin and cefepime.  Start IV Unasyn.  Cultures pending. Will add scheduled nebulizers, Mucomyst.  Chest physiotherapy.  *Left pleural effusion.  Does not seem to be empyema on CT chest.  Likely due to lung collapse and pneumonia.  Discussed with Dr. Kathlene Cote of radiology.  Patient will have  thoracentesis soon.  *Acute toxic metabolic encephalopathy over dementia.  Monitor.  This with Dr. Yancey Flemings of pulmonary and Dr. Kathlene Cote of radiology.  Patient is DNR and family does not want any aggressive bronchoscopy or ventilatory support.  We discussed regarding thoracentesis and see if patient would improve.  I added Mucomyst nebulizers, scheduled albuterol, chest physiotherapy.  Discussed with respiratory therapy.  Difficult situation.  Will remove 1 L of fluid and see if patient has any improvement in his respiratory status.  Seems to have a neoplastic process blocking his left main bronchus.  Discussed with family in detail.  Will add oral morphine for air hunger.  Poor prognosis.  Will be a candidate for hospice services at discharge.  If no improvement will have to look at hospice home.   All the records are reviewed and case discussed with Care Management/Social Worker Management plans discussed with the patient, family and they are in agreement.  CODE STATUS: DNR  DVT Prophylaxis: SCDs  TOTAL CC TIME TAKING CARE OF THIS PATIENT: 80 minutes.   POSSIBLE D/C IN 2-3 DAYS, DEPENDING ON CLINICAL CONDITION.  Neita Carp M.D on 06/16/2017 at 12:26 PM  Between 7am to 6pm - Pager - [phone]  After 6pm go to [email or web address] - password [email or web address] [facility]  Office  [phone]  CC: Primary care physician; [name], MD  Note: This dictation was prepared with Dragon dictation along with smaller phrase technology. Any transcriptional errors that result from this process are unintentional.

## 2017-06-16 NOTE — Plan of Care (Addendum)
Pt responds to stimuli. Oriented to self only. VSS. 5L O2 [place] continued. IV antibiotics given per orders. Pt went for thoracentesis and chest CT this shift. Sacral foam dressing remains CDI. Pt being repositioned q2 hours. IVF stopped this shift per order. Family at bedside. Condom catheter in place. Will continue to monitor and report to oncoming RN .  Progressing Spiritual Needs Ability to function at adequate level 06/16/2017 1423 - Progressing by Aleen Campi, RN Education: Knowledge of General Education information will improve 06/16/2017 1423 - Progressing by Aleen Campi, RN Health Behavior/Discharge Planning: Ability to manage health-related needs will improve 06/16/2017 1423 - Progressing by Aleen Campi, RN Clinical Measurements: Ability to maintain clinical measurements within normal limits will improve 06/16/2017 1423 - Progressing by Aleen Campi, RN Will remain free from infection 06/16/2017 1423 - Progressing by Aleen Campi, RN Diagnostic test results will improve 06/16/2017 1423 - Progressing by Aleen Campi, RN Respiratory complications will improve 06/16/2017 1423 - Progressing by Aleen Campi, RN Cardiovascular complication will be avoided 06/16/2017 1423 - Progressing by Aleen Campi, RN Activity: Risk for activity intolerance will decrease 06/16/2017 1423 - Progressing by Aleen Campi, RN Nutrition: Adequate nutrition will be maintained 06/16/2017 1423 - Progressing by Aleen Campi, RN Coping: Level of anxiety will decrease 06/16/2017 1423 - Progressing by Aleen Campi, RN Elimination: Will not experience complications related to bowel motility 06/16/2017 1423 - Progressing by Aleen Campi, RN Will not experience complications related to urinary retention 06/16/2017 1423 - Progressing by Aleen Campi, RN Pain Managment: General experience of comfort will improve 06/16/2017 1423 - Progressing by Aleen Campi, RN Safety: Ability to remain free  from injury will improve 06/16/2017 1423 - Progressing by Aleen Campi, RN Skin Integrity: Risk for impaired skin integrity will decrease 06/16/2017 1423 - Progressing by Aleen Campi, RN Activity: Ability to tolerate increased activity will improve 06/16/2017 1423 - Progressing by Aleen Campi, RN Clinical Measurements: Ability to maintain a body temperature in the normal range will improve 06/16/2017 1423 - Progressing by Aleen Campi, RN Respiratory: Ability to maintain adequate ventilation will improve 06/16/2017 1423 - Progressing by Aleen Campi, RN Ability to maintain a clear airway will improve 06/16/2017 1423 - Progressing by Aleen Campi, RN

## 2017-06-17 LAB — CBC WITH DIFFERENTIAL/PLATELET
Basophils Absolute: 0 10*3/uL (ref 0–0.1)
Basophils Relative: 0 %
Eosinophils Absolute: 0.1 10*3/uL (ref 0–0.7)
Eosinophils Relative: 1 %
HEMATOCRIT: 29.8 % — AB (ref 40.0–52.0)
HEMOGLOBIN: 9.4 g/dL — AB (ref 13.0–18.0)
LYMPHS ABS: 0.8 10*3/uL — AB (ref 1.0–3.6)
LYMPHS PCT: 8 %
MCH: 28.1 pg (ref 26.0–34.0)
MCHC: 31.5 g/dL — AB (ref 32.0–36.0)
MCV: 89.2 fL (ref 80.0–100.0)
MONOS PCT: 7 %
Monocytes Absolute: 0.7 10*3/uL (ref 0.2–1.0)
NEUTROS ABS: 8.4 10*3/uL — AB (ref 1.4–6.5)
NEUTROS PCT: 84 %
Platelets: 319 10*3/uL (ref 150–440)
RBC: 3.34 MIL/uL — ABNORMAL LOW (ref 4.40–5.90)
RDW: 17.3 % — ABNORMAL HIGH (ref 11.5–14.5)
WBC: 10.1 10*3/uL (ref 3.8–10.6)

## 2017-06-17 LAB — BASIC METABOLIC PANEL
ANION GAP: 7 (ref 5–15)
BUN: 29 mg/dL — ABNORMAL HIGH (ref 6–20)
CHLORIDE: 111 mmol/L (ref 101–111)
CO2: 33 mmol/L — AB (ref 22–32)
Calcium: 8.5 mg/dL — ABNORMAL LOW (ref 8.9–10.3)
Creatinine, Ser: 0.98 mg/dL (ref 0.61–1.24)
GFR calc non Af Amer: >60 mL/min (ref >60)
Glucose, Bld: 114 mg/dL — ABNORMAL HIGH (ref 65–99)
POTASSIUM: 3.3 mmol/L — AB (ref 3.5–5.1)
Sodium: 151 mmol/L — ABNORMAL HIGH (ref 135–145)

## 2017-06-17 LAB — LACTATE DEHYDROGENASE: LDH: 186 U/L (ref 98–192)

## 2017-06-17 MED ORDER — MORPHINE SULFATE (CONCENTRATE) 10 MG/0.5ML PO SOLN
5.0000 mg | ORAL | Status: DC | PRN
  Administered 2017-06-17 – 2017-06-18 (×4): 5 mg via SUBLINGUAL
  Filled 2017-06-17 (×4): qty 1

## 2017-06-17 MED ORDER — LORAZEPAM 2 MG/ML PO CONC: 1.0000 mg | ORAL | Status: DC | PRN

## 2017-06-17 MED ORDER — ACETAMINOPHEN 325 MG PO TABS: 650.0000 mg | ORAL_TABLET | Freq: Four times a day (QID) | ORAL | Status: DC | PRN

## 2017-06-17 MED ORDER — MORPHINE SULFATE (CONCENTRATE) 10 MG/0.5ML PO SOLN: 5.0000 mg | ORAL | Status: AC | PRN

## 2017-06-17 MED ORDER — LORAZEPAM 1 MG PO TABS: 1.0000 mg | ORAL_TABLET | ORAL | Status: DC | PRN

## 2017-06-17 MED ORDER — LORAZEPAM 2 MG/ML IJ SOLN: 1.0000 mg | INTRAMUSCULAR | Status: DC | PRN

## 2017-06-17 MED ORDER — MORPHINE SULFATE (CONCENTRATE) 10 MG/0.5ML PO SOLN: 5.0000 mg | ORAL | Status: DC | PRN

## 2017-06-17 MED ORDER — LORAZEPAM 1 MG PO TABS: 1.0000 mg | ORAL_TABLET | ORAL | 0 refills | Status: AC | PRN

## 2017-06-17 MED ORDER — ONDANSETRON HCL 4 MG/2ML IJ SOLN: 4.0000 mg | Freq: Four times a day (QID) | INTRAMUSCULAR | Status: DC | PRN

## 2017-06-17 MED ORDER — ATROPINE SULFATE 1 % OP SOLN
4.0000 [drp] | OPHTHALMIC | Status: DC | PRN
  Administered 2017-06-17 – 2017-06-18 (×3): 4 [drp] via SUBLINGUAL
  Filled 2017-06-17: qty 2

## 2017-06-17 MED ORDER — ACETAMINOPHEN 650 MG RE SUPP: 650.0000 mg | Freq: Four times a day (QID) | RECTAL | Status: DC | PRN

## 2017-06-17 MED ORDER — MORPHINE SULFATE (PF) 2 MG/ML IV SOLN
2.0000 mg | INTRAVENOUS | Status: DC | PRN
  Administered 2017-06-17: 2 mg via INTRAVENOUS
  Filled 2017-06-17: qty 1

## 2017-06-17 MED ORDER — ONDANSETRON 4 MG PO TBDP
4.0000 mg | ORAL_TABLET | Freq: Four times a day (QID) | ORAL | Status: DC | PRN
  Filled 2017-06-17: qty 1

## 2017-06-17 NOTE — Plan of Care (Addendum)
Pt is drowsy, nonverbal. Comfort care measures initiated this shift per order. O2 [place] for comfort. Cardiac monitor d/c this shift per order. Family at bedside. IV morphine provided prn per order as appropriate. Pt turned Q2 hours. Incontinent. Plan for discharge to hospice when available. Will continue to monitor and report to oncoming RN.  Not Progressing Spiritual Needs Ability to function at adequate level 06/17/2017 1434 - Not Progressing by Aleen Campi, RN Education: Knowledge of General Education information will improve 06/17/2017 1434 - Not Progressing by Aleen Campi, RN Health Behavior/Discharge Planning: Ability to manage health-related needs will improve 06/17/2017 1434 - Not Progressing by Aleen Campi, RN Clinical Measurements: Ability to maintain clinical measurements within normal limits will improve 06/17/2017 1434 - Not Progressing by Aleen Campi, RN Will remain free from infection 06/17/2017 1434 - Not Progressing by Aleen Campi, RN Diagnostic test results will improve 06/17/2017 1434 - Not Progressing by Aleen Campi, RN Respiratory complications will improve 06/17/2017 1434 - Not Progressing by Aleen Campi, RN Cardiovascular complication will be avoided 06/17/2017 1434 - Not Progressing by Aleen Campi, RN Activity: Risk for activity intolerance will decrease 06/17/2017 1434 - Not Progressing by Aleen Campi, RN Nutrition: Adequate nutrition will be maintained 06/17/2017 1434 - Not Progressing by Aleen Campi, RN Coping: Level of anxiety will decrease 06/17/2017 1434 - Not Progressing by Aleen Campi, RN Elimination: Will not experience complications related to bowel motility 06/17/2017 1434 - Not Progressing by Aleen Campi, RN Will not experience complications related to urinary retention 06/17/2017 1434 - Not Progressing by Aleen Campi, RN Pain Managment: General experience of comfort will improve 06/17/2017 1434 - Not Progressing by  Aleen Campi, RN Safety: Ability to remain free from injury will improve 06/17/2017 1434 - Not Progressing by Aleen Campi, RN Skin Integrity: Risk for impaired skin integrity will decrease 06/17/2017 1434 - Not Progressing by Aleen Campi, RN Activity: Ability to tolerate increased activity will improve 06/17/2017 1434 - Not Progressing by Aleen Campi, RN Clinical Measurements: Ability to maintain a body temperature in the normal range will improve 06/17/2017 1434 - Not Progressing by Aleen Campi, RN Respiratory: Ability to maintain adequate ventilation will improve 06/17/2017 1434 - Not Progressing by Aleen Campi, RN Ability to maintain a clear airway will improve 06/17/2017 1434 - Not Progressing by Aleen Campi, RN

## 2017-06-17 NOTE — Clinical Social Work Note (Addendum)
CSW received consult that patient's family would like her to go to [facility].  CSW contacted [facility], who requested clinical information be faxed to them.  CSW faxed required information, awaiting for call back from the hospice home.  [facility] contacted CSW and they do not have a bed available for today patient is on wait list.  CSW updated physician, family, and bedside nurse.  Jones Broom. Moores Waas, MSW, [place]  06/17/2017 12:45 PM

## 2017-06-17 NOTE — Care Management (Signed)
It has been recommended that patient discharge to hospice home today

## 2017-06-17 NOTE — Progress Notes (Signed)
[facility] NAME: Charles Gallagher    MR#:  [id number]  DATE OF BIRTH:  [birth year]  SUBJECTIVE:  CHIEF COMPLAINT:   Chief Complaint  Patient presents with  . Pneumonia   In resp distress. HFNC  REVIEW OF SYSTEMS:    Review of Systems  Unable to perform ROS: Dementia    DRUG ALLERGIES:  No Known Allergies  VITALS:  Blood pressure 117/61, pulse 87, temperature 97.6 F (36.4 C), temperature source Oral, resp. rate 20, height 5\' 9"  (1.753 m), weight 81.6 kg (180 lb), SpO2 97 %.  PHYSICAL EXAMINATION:   Physical Exam  GENERAL:  83 y.o.-year-old patient lying in the bed EYES: Pupils equal, round, reactive to light and accommodation. No scleral icterus. Extraocular muscles intact.  HEENT: Head atraumatic, normocephalic. Oropharynx and nasopharynx clear.  NECK:  Supple, no jugular venous distention.  LUNGS:Absent breath sounds left CARDIOVASCULAR: S1, S2  ABDOMEN: Soft, nontender, nondistended. EXTREMITIES: No cyanosis, clubbing PSYCHIATRIC: The patient is drowzy SKIN: No obvious rash, lesion, or ulcer.   LABORATORY PANEL:   CBC Recent Labs  Lab 06/17/17 0522  WBC 10.1  HGB 9.4*  HCT 29.8*  PLT 319   ------------------------------------------------------------------------------------------------------------------ Chemistries  Recent Labs  Lab 05/28/2017 1718 06/17/17 0522  NA 149* 151*  K 3.5 3.3*  CL 107 111  CO2 30 33*  GLUCOSE 128* 114*  BUN 33* 29*  CREATININE 1.23 0.98  CALCIUM 8.7* 8.5*  AST 108*  --   ALT 133*  --   ALKPHOS 173*  --   BILITOT 0.7  --    ------------------------------------------------------------------------------------------------------------------  Cardiac Enzymes No results for input(s): TROPONINI in the last 168 hours. ------------------------------------------------------------------------------------------------------------------  RADIOLOGY:  Ct Chest W Contrast  Result  Date: 06/16/2017 CLINICAL DATA:  Dema Severin out of left chest by chest x-ray. Recent pneumonia. EXAM: CT CHEST WITH CONTRAST TECHNIQUE: Multidetector CT imaging of the chest was performed during intravenous contrast administration. CONTRAST:  45mL ISOVUE-300 IOPAMIDOL (ISOVUE-300) INJECTION 61% COMPARISON:  Chest x-ray on 06/04/2017 FINDINGS: Cardiovascular: The heart size is within normal limits. The thoracic aorta is of normal caliber and demonstrates calcified plaque. There is evidence of prior CABG. No pericardial fluid. Central pulmonary arteries are normal in caliber. Mediastinum/Nodes: No evidence of mediastinal masses. Borderline to mildly enlarged mediastinal lymph nodes present with right lower paratracheal lymph node measuring 1.3 cm and subcarinal lymph node measuring 1.5 cm. Mildly prominent right hilar lymph node measures 1.1 cm. Lungs/Pleura: There is a large left pleural effusion and complete collapse and atelectasis of the left lung with no aerated lung present. The left mainstem bronchus is occluded by material representing either mucous or tumor. No obvious pleural masses or evidence pleural enhancement/thickening to suggest empyema. Pleural fluid density is low. There is some calcification in the anterior and lateral left lung. There is some emphysematous disease in the upper right lung. Opacity in the posterior right upper lobe may be chronic as it appears to be associated with some mild bronchiectasis. There is some scattered scarring throughout the right lung. Small right lower lobe nodule towards the posterior lung base measures 0.8 cm. Upper Abdomen: Calcified granuloma present in the spleen. No adrenal masses. Musculoskeletal: No chest wall abnormality. No acute or significant osseous findings. IMPRESSION: 1. Complete atelectasis of the left lung with occlusion of the left mainstem bronchus by either mucous or tumor. Pulmonology consultation recommended. 2. Large left pleural effusion. No  pleural masses or evidence of pleural enhancement. 3.  Mildly enlarged right lower paratracheal and subcarinal lymph nodes. Neoplastic process cannot be excluded. There also is a mildly prominent right hilar lymph node. 4. Chronic emphysematous lung disease in the right lung with some focal bronchiectasis in the posterior right upper lobe. 5. 8 mm nodule at the posterior right lung base. Emphysema (ICD10-J43.9). Electronically Signed   By: Aletta Edouard M.D.   On: 06/16/2017 09:25   Dg Chest Port 1 View  Result Date: 06/16/2017 CLINICAL DATA:  Status post left thoracentesis EXAM: PORTABLE CHEST 1 VIEW COMPARISON:  06/20/2017 FINDINGS: Large left pleural effusion. No pneumothorax following left thoracentesis. Near complete opacification of the left hemithorax with only a small amount of aerated left lung. Right lung is clear. IMPRESSION: No pneumothorax following left thoracentesis. Near complete opacification of the left hemithorax. Electronically Signed   By: Rolm Baptise M.D.   On: 06/16/2017 14:20   Dg Chest Port 1 View  Result Date: 06/01/2017 CLINICAL DATA:  Recently diagnosed with pneumonia and placed on Levaquin since 06/05/2017. Tachypnea. EXAM: PORTABLE CHEST 1 VIEW COMPARISON:  09/24/2013 FINDINGS: Sternotomy wires unchanged. Examination demonstrates complete opacification of the left hemithorax likely combination of pleural fluid and atelectasis. Prominence right hilar density. Right lung otherwise clear. Cardiac silhouette is mostly obscured. Remaining bones and soft tissues unchanged. IMPRESSION: Complete opacification left hemithorax likely combination of pleural fluid and atelectasis. Prominence soft tissues density of the right hilum. Recommend contrast-enhanced chest CT to evaluate for underlying mass and adenopathy. Electronically Signed   By: Marin Olp M.D.   On: 05/31/2017 17:56   US Thoracentesis Asp Pleural Space W/img Guide  Result Date: 06/16/2017 INDICATION: Shortness of  breath. Large left pleural effusion. Request for diagnostic and therapeutic thoracentesis. EXAM: ULTRASOUND GUIDED LEFT THORACENTESIS MEDICATIONS: None. COMPLICATIONS: None immediate. PROCEDURE: An ultrasound guided thoracentesis was thoroughly discussed with the patient and questions answered. The benefits, risks, alternatives and complications were also discussed. The patient understands and wishes to proceed with the procedure. Written consent was obtained. Ultrasound was performed to localize and mark an adequate pocket of fluid in the left chest. The area was then prepped and draped in the normal sterile fashion. 1% Lidocaine was used for local anesthesia. Under ultrasound guidance a Safe-T-Centesis catheter was introduced. Thoracentesis was performed. The catheter was removed and a dressing applied. FINDINGS: A total of approximately 1 L of bloody pleural fluid was removed. Samples were sent to the laboratory as requested by the clinical team. IMPRESSION: Successful ultrasound guided left thoracentesis yielding 1 L of pleural fluid. Read by: Ascencion Dike PA-C Electronically Signed   By: Aletta Edouard M.D.   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  No improvement. Will transfer to hospice home when bed available    All the records are reviewed and case discussed with Care Management/Social Worker Management plans discussed with the patient, family and they are in agreement.  CODE STATUS: DNR  DVT Prophylaxis: SCDs  TOTAL TIME TAKING CARE OF THIS  PATIENT: 25 minutes.   POSSIBLE D/C IN 1-2 DAYS, DEPENDING ON CLINICAL CONDITION.  Neita Carp M.D on 06/17/2017 at 5:21 PM  Between 7am to 6pm -  Pager - [phone]  After 6pm go to [email or web address] - password [email or web address] [facility]  Office  [phone]  CC: Primary care physician; [name], MD  Note: This dictation was prepared with Dragon dictation along with smaller phrase technology. Any transcriptional errors that result from this process are unintentional.

## 2017-06-17 NOTE — Progress Notes (Addendum)
Advance care planning  Met with patient's wife, son, daughter and son-in-law at bedside.  Patient is presently on high flow nasal cannula with further worsening overnight.  Very drowsy.  Discussed regarding repeat chest x-ray and patient's prognosis with bloody pleural effusion which could likely be malignant.  Patient with advanced dementia and DO NOT RESUSCITATE.  Family does not want any aggressive bronchoscopy or surgeries.  Patient seems to be at end of life and would be a candidate for hospice home.  Discussed with family.  Will transition to comfort measures.  Transfer to hospice home when bed available.  Will change high flow nasal cannula to regular nasal cannula.  No further blood work.  Vitals only as needed.  Pain and anxiety medications.  Life expectancy < 1 weeks  Time spent 20 minutes.

## 2017-06-17 NOTE — Discharge Summary (Addendum)
[facility] [place] NAME: Charles Gallagher    MR#:  [id number]  DATE OF BIRTH:  06-Mar-1929  DATE OF ADMISSION:  05/28/2017 ADMITTING PHYSICIAN: Gorden Harms, MD  DATE OF DISCHARGE: 06/17/2017  PRIMARY CARE PHYSICIAN: [name], MD   ADMISSION DIAGNOSIS:  Pleural effusion [J90] Hypoxia [R09.02] Sepsis, due to unspecified organism ([place]) [A41.9] Pneumonia of left lung due to infectious organism, unspecified part of lung [J18.9]  DISCHARGE DIAGNOSIS:  Active Problems:   HCAP (healthcare-associated pneumonia)   Pressure injury of skin   SECONDARY DIAGNOSIS:   Past Medical History:  Diagnosis Date  . CAD (coronary artery disease)   . Cancer ([place])   . Carotid stenosis   . Collagen vascular disease ([place])    cabg  . COPD (chronic obstructive pulmonary disease) ([place])   . Dementia   . Hyperlipidemia   . Hypertension   . PVD (peripheral vascular disease) ([place])   . Stroke [facility])      ADMITTING HISTORY  HISTORY OF PRESENT ILLNESS: Charles Gallagher  is a 83 y.o. male with a known history per below, from local nursing home, recently treated with Levaquin started 2 days ago for pneumonia, presents today with altered mental status, noted hypoxia, ER workup noted for large left pleural effusion, right hilar density for which CT of the chest was recommended, white count of 14,000, AST 108, ALT 133, sodium 149, patient evaluated in the emergency room, multiple family members at the bedside, patient is DNR status, patient is poor historian due to dementia, patient satting 91-95% on room air-noted mouth breathing, patient now been admitted for healthcare associated pneumonia and large left pleural effusion.  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  Started on broad-spectrum IV antibiotics, steroids and breathing treatments.  I discussed the case with radiology and pulmonary.  Patient had thoracentesis done with 1 L of bloody fluid which seems to be exudate.  In spite of thoracentesis, chest physiotherapy, scheduled nebulizers and Mucomyst patient has not improved and continues to worsen.  Presently he is on high flow nasal cannula.  With significant worsening and poor prognosis family have decided not to pursue any aggressive measures including bronchoscopy.  At this point patient will be transferred to hospice home for end-of-life care.  Comfort measures initiated.  Life expectancy < 1 week  CONSULTS OBTAINED:    DRUG ALLERGIES:  No Known Allergies  DISCHARGE MEDICATIONS:   Allergies as of 06/17/2017   No Known Allergies     Medication List    STOP taking these medications   acetaminophen 500 MG tablet Commonly known as:  TYLENOL   ACIDOPHILUS PO   albuterol 108 (90 Base) MCG/ACT inhaler Commonly known as:  PROVENTIL HFA;VENTOLIN HFA   aspirin EC 81 MG tablet   cetirizine 10 MG tablet Commonly known as:  ZYRTEC   furosemide 40 MG tablet Commonly known as:  LASIX   ipratropium-albuterol 0.5-2.5 (3) MG/3ML Soln Commonly known as:  DUONEB   levofloxacin 500 MG tablet Commonly known as:  LEVAQUIN   nystatin powder Generic drug:  nystatin   oxyCODONE 5 MG immediate release tablet Commonly known  as:  ROXICODONE   potassium chloride SA 15 MEQ tablet Commonly known as:  KLOR-CON M15   sertraline 25 MG tablet Commonly known as:  ZOLOFT   simvastatin 40 MG tablet Commonly known as:  ZOCOR     TAKE these medications   LORazepam 1 MG tablet Commonly known as:  ATIVAN Take  1 tablet (1 mg total) by mouth every 4 (four) hours as needed for anxiety.   morphine CONCENTRATE 10 MG/0.5ML Soln concentrated solution Take 0.25 mLs (5 mg total) by mouth every 2 (two) hours as needed for moderate pain (or dyspnea).       Today   VITAL SIGNS:  Blood pressure 117/61, pulse 87, temperature 97.6 F (36.4 C), temperature source Oral, resp. rate 20, height 5\' 9"  (1.753 m), weight 81.6 kg (180 lb), SpO2 97 %.  I/O:    Intake/Output Summary (Last 24 hours) at 06/17/2017 1138 Last data filed at 06/17/2017 0956 Gross per 24 hour  Intake 100 ml  Output -  Net 100 ml    PHYSICAL EXAMINATION:  Physical Exam  GENERAL:  83 y.o.-year-old patient lying in the bed LUNGS: Absent breath sounds left CARDIOVASCULAR: S1, S2  ABDOMEN: Soft, non-tender PSYCHIATRIC: The patient isdrowzy  DATA REVIEW:   CBC Recent Labs  Lab 06/17/17 0522  WBC 10.1  HGB 9.4*  HCT 29.8*  PLT 319    Chemistries  Recent Labs  Lab 06/05/2017 1718 06/17/17 0522  NA 149* 151*  K 3.5 3.3*  CL 107 111  CO2 30 33*  GLUCOSE 128* 114*  BUN 33* 29*  CREATININE 1.23 0.98  CALCIUM 8.7* 8.5*  AST 108*  --   ALT 133*  --   ALKPHOS 173*  --   BILITOT 0.7  --     Cardiac Enzymes No results for input(s): TROPONINI in the last 168 hours.  Microbiology Results  Results for orders placed or performed during the hospital encounter of 06/17/2017  Blood Culture (routine x 2)     Status: None (Preliminary result)   Collection Time: 06/16/2017  5:18 PM  Result Value Ref Range Status   Specimen Description BLOOD RT FOREARM  Final   Special Requests   Final    BOTTLES DRAWN AEROBIC AND ANAEROBIC Blood Culture adequate volume   Culture   Final    NO GROWTH 2 DAYS Performed at [facility], [address] [place], [place] [address]    Report Status PENDING  Incomplete  Blood Culture (routine x 2)     Status: None (Preliminary result)   Collection Time: 06/18/2017  5:23 PM  Result Value  Ref Range Status   Specimen Description BLOOD LT WRIST  Final   Special Requests   Final    BOTTLES DRAWN AEROBIC AND ANAEROBIC Blood Culture adequate volume   Culture   Final    NO GROWTH 2 DAYS Performed at [facility], [place], [address], [place], [place] [address]    Report Status PENDING  Incomplete  MRSA PCR Screening     Status: None   Collection Time: 06/16/17  4:35 AM  Result Value Ref Range Status   MRSA by PCR NEGATIVE NEGATIVE Final    Comment:        The GeneXpert MRSA Assay (FDA approved for NASAL specimens only), is one component of a comprehensive MRSA colonization surveillance program. It is not intended to diagnose MRSA infection nor to guide or monitor treatment for MRSA infections. Performed at [facility], [phone] [place], [place] [address]   Body fluid culture     Status: None (Preliminary result)   Collection Time: 06/16/17  1:45 PM  Result Value Ref Range Status   Specimen Description   Final    PLEURAL LEFT Performed at [facility], [address] [place], [place] [address]    Special Requests   Final    NONE Performed at [facility], [place], [place] [address]    Gram Stain   Final    RARE WBC PRESENT,BOTH PMN AND MONONUCLEAR NO ORGANISMS SEEN    Culture   Final    NO GROWTH 1 DAY Performed at [facility], [place] [address] [place], [place] [address]    Report Status PENDING  Incomplete    RADIOLOGY:  Ct Chest W Contrast  Result Date: 06/16/2017 CLINICAL DATA:  Dema Severin out of left chest by chest x-ray. Recent pneumonia. EXAM: CT CHEST WITH CONTRAST TECHNIQUE: Multidetector CT imaging of the chest was performed during intravenous contrast administration. CONTRAST:  16mL ISOVUE-300 IOPAMIDOL (ISOVUE-300) INJECTION 61% COMPARISON:  Chest x-ray on 06/14/2017 FINDINGS: Cardiovascular: The heart size is within normal limits. The thoracic aorta is of normal caliber and  demonstrates calcified plaque. There is evidence of prior CABG. No pericardial fluid. Central pulmonary arteries are normal in caliber. Mediastinum/Nodes: No evidence of mediastinal masses. Borderline to mildly enlarged mediastinal lymph nodes present with right lower paratracheal lymph node measuring 1.3 cm and subcarinal lymph node measuring 1.5 cm. Mildly prominent right hilar lymph node measures 1.1 cm. Lungs/Pleura: There is a large left pleural effusion and complete collapse and atelectasis of the left lung with no aerated lung present. The left mainstem bronchus is occluded by material representing either mucous or tumor. No obvious pleural masses or evidence pleural enhancement/thickening to suggest empyema. Pleural fluid density is low. There is some calcification in the anterior and lateral left lung. There is some emphysematous disease in the upper right lung. Opacity in the posterior right upper lobe may be chronic as it appears to be associated with some mild bronchiectasis. There is some scattered scarring throughout the right lung. Small right lower lobe nodule towards the posterior lung base measures 0.8 cm. Upper Abdomen: Calcified granuloma present in the spleen. No adrenal masses. Musculoskeletal: No chest wall abnormality. No acute or significant osseous findings. IMPRESSION: 1. Complete atelectasis of the left lung with occlusion of the left mainstem bronchus by either mucous or tumor. Pulmonology consultation recommended. 2. Large left pleural effusion. No pleural masses or evidence of pleural enhancement. 3. Mildly enlarged right lower paratracheal and subcarinal lymph nodes. Neoplastic process cannot be excluded. There also is a mildly prominent right hilar lymph node. 4. Chronic emphysematous lung disease in the right lung with some focal bronchiectasis in the posterior right upper lobe. 5. 8 mm nodule at the posterior right lung base. Emphysema (ICD10-J43.9). Electronically Signed   By:  Aletta Edouard M.D.   On: 06/16/2017 09:25   Dg Chest Port 1 View  Result Date: 06/16/2017 CLINICAL DATA:  Status post left thoracentesis EXAM: PORTABLE CHEST 1 VIEW COMPARISON:  06/18/2017 FINDINGS: Large left pleural effusion. No pneumothorax following left thoracentesis. Near complete opacification of the left hemithorax with only a small amount of aerated left lung. Right lung is clear. IMPRESSION: No pneumothorax following left thoracentesis. Near complete opacification of the left hemithorax. Electronically Signed   By: Rolm Baptise M.D.   On: 06/16/2017 14:20   Dg Chest  Port 1 View  Result Date: 06/11/2017 CLINICAL DATA:  Recently diagnosed with pneumonia and placed on Levaquin since 06/05/2017. Tachypnea. EXAM: PORTABLE CHEST 1 VIEW COMPARISON:  09/24/2013 FINDINGS: Sternotomy wires unchanged. Examination demonstrates complete opacification of the left hemithorax likely combination of pleural fluid and atelectasis. Prominence right hilar density. Right lung otherwise clear. Cardiac silhouette is mostly obscured. Remaining bones and soft tissues unchanged. IMPRESSION: Complete opacification left hemithorax likely combination of pleural fluid and atelectasis. Prominence soft tissues density of the right hilum. Recommend contrast-enhanced chest CT to evaluate for underlying mass and adenopathy. Electronically Signed   By: Marin Olp M.D.   On: 06/10/2017 17:56   US Thoracentesis Asp Pleural Space W/img Guide  Result Date: 06/16/2017 INDICATION: Shortness of breath. Large left pleural effusion. Request for diagnostic and therapeutic thoracentesis. EXAM: ULTRASOUND GUIDED LEFT THORACENTESIS MEDICATIONS: None. COMPLICATIONS: None immediate. PROCEDURE: An ultrasound guided thoracentesis was thoroughly discussed with the patient and questions answered. The benefits, risks, alternatives and complications were also discussed. The patient understands and wishes to proceed with the procedure. Written  consent was obtained. Ultrasound was performed to localize and mark an adequate pocket of fluid in the left chest. The area was then prepped and draped in the normal sterile fashion. 1% Lidocaine was used for local anesthesia. Under ultrasound guidance a Safe-T-Centesis catheter was introduced. Thoracentesis was performed. The catheter was removed and a dressing applied. FINDINGS: A total of approximately 1 L of bloody pleural fluid was removed. Samples were sent to the laboratory as requested by the clinical team. IMPRESSION: Successful ultrasound guided left thoracentesis yielding 1 L of pleural fluid. Read by: Ascencion Dike PA-C Electronically Signed   By: Aletta Edouard M.D.   On: 06/16/2017 14:21    Follow up with PCP in 1 week.  Management plans discussed with the patient, family and they are in agreement.  CODE STATUS:     Code Status Orders  (From admission, onward)        Start     Ordered   06/17/17 1031  Do not attempt resuscitation (DNR)  Continuous    Question Answer Comment  In the event of cardiac or respiratory ARREST Do not call a "code blue"   In the event of cardiac or respiratory ARREST Do not perform Intubation, CPR, defibrillation or ACLS   In the event of cardiac or respiratory ARREST Use medication by any route, position, wound care, and other measures to relive pain and suffering. May use oxygen, suction and manual treatment of airway obstruction as needed for comfort.      06/17/17 1031    Code Status History    Date Active Date Inactive Code Status Order ID Comments User Context   06/07/2017 20:34 06/17/2017 10:31 DNR [id number]  Gorden Harms, MD ED   10/26/2016 21:45 10/29/2016 18:29 Full Code [id number]  Demetrios Loll, MD Inpatient    Advance Directive Documentation     Most Recent Value  Type of Advance Directive  Out of facility DNR (pink MOST or yellow form), Healthcare Power of Attorney  Pre-existing out of facility DNR order (yellow form or pink MOST  form)  Yellow form placed in chart (order not valid for inpatient use)  "MOST" Form in Place?  No data      TOTAL TIME TAKING CARE OF THIS PATIENT ON DAY OF DISCHARGE: more than 30 minutes.   Neita Carp M.D on 06/17/2017 at 11:38 AM  Between 7am to 6pm - Pager - [phone]  After 6pm go to [email or web address] - password [email or web address]  Office  [phone]  CC: Primary care physician; [name], MD  Note: This dictation was prepared with Dragon dictation along with smaller phrase technology. Any transcriptional errors that result from this process are unintentional.

## 2017-06-17 NOTE — Progress Notes (Signed)
Took patient off of HFNC and placed on  Williams for comfort per order.

## 2017-06-17 NOTE — Care Management Important Message (Signed)
Important Message  Patient Details  Name: LINZIE BOURSIQUOT MRN: [id number] Date of Birth: 1928-10-25   Medicare Important Message Given:  N/A - LOS <3 / Initial given by admissions  Transferring to the hospice home for end of life care.  Katrina Stack, RN 06/17/2017, 11:55 AM

## 2017-06-18 LAB — HIV ANTIBODY (ROUTINE TESTING W REFLEX): HIV SCREEN 4TH GENERATION: NONREACTIVE

## 2017-06-18 MED ORDER — MORPHINE SULFATE (PF) 4 MG/ML IV SOLN
4.0000 mg | INTRAVENOUS | Status: DC
  Administered 2017-06-18 – 2017-06-19 (×6): 4 mg via INTRAVENOUS
  Filled 2017-06-18 (×6): qty 1

## 2017-06-18 MED ORDER — GLYCOPYRROLATE 0.2 MG/ML IJ SOLN
0.1000 mg | Freq: Four times a day (QID) | INTRAMUSCULAR | Status: DC
  Administered 2017-06-18 – 2017-06-19 (×4): 0.1 mg via INTRAVENOUS
  Filled 2017-06-18 (×6): qty 0.5

## 2017-06-18 NOTE — Progress Notes (Signed)
[facility] NAME: Phineas Mcenroe    MR#:  [id number]  DATE OF BIRTH:  [birth year]  SUBJECTIVE:  CHIEF COMPLAINT:   Chief Complaint  Patient presents with  . Pneumonia   Gurgling sounds. No responding  REVIEW OF SYSTEMS:    Review of Systems  Unable to perform ROS: Dementia    DRUG ALLERGIES:  No Known Allergies  VITALS:  Blood pressure 117/61, pulse 87, temperature 97.6 F (36.4 C), temperature source Oral, resp. rate 20, height 5\' 9"  (1.753 m), weight 81.6 kg (180 lb), SpO2 97 %.  PHYSICAL EXAMINATION:   Physical Exam  GENERAL:  83 y.o.-year-old patient lying in the bed HEENT: Dry mouth LUNGS:Absent breath sounds left CARDIOVASCULAR: S1, S2  ABDOMEN: Soft EXTREMITIES: No cyanosis PSYCHIATRIC: The patient is drowzy  LABORATORY PANEL:   CBC Recent Labs  Lab 06/17/17 0522  WBC 10.1  HGB 9.4*  HCT 29.8*  PLT 319   ------------------------------------------------------------------------------------------------------------------ Chemistries  Recent Labs  Lab 06/12/2017 1718 06/17/17 0522  NA 149* 151*  K 3.5 3.3*  CL 107 111  CO2 30 33*  GLUCOSE 128* 114*  BUN 33* 29*  CREATININE 1.23 0.98  CALCIUM 8.7* 8.5*  AST 108*  --   ALT 133*  --   ALKPHOS 173*  --   BILITOT 0.7  --    ------------------------------------------------------------------------------------------------------------------  Cardiac Enzymes No results for input(s): TROPONINI in the last 168 hours. ------------------------------------------------------------------------------------------------------------------  RADIOLOGY:  Dg Chest Port 1 View  Result Date: 06/16/2017 CLINICAL DATA:  Status post left thoracentesis EXAM: PORTABLE CHEST 1 VIEW COMPARISON:  05/29/2017 FINDINGS: Large left pleural effusion. No pneumothorax following left thoracentesis. Near complete opacification of the left hemithorax with only a small amount of  aerated left lung. Right lung is clear. IMPRESSION: No pneumothorax following left thoracentesis. Near complete opacification of the left hemithorax. Electronically Signed   By: Rolm Baptise M.D.   On: 06/16/2017 14:20   US Thoracentesis Asp Pleural Space W/img Guide  Result Date: 06/16/2017 INDICATION: Shortness of breath. Large left pleural effusion. Request for diagnostic and therapeutic thoracentesis. EXAM: ULTRASOUND GUIDED LEFT THORACENTESIS MEDICATIONS: None. COMPLICATIONS: None immediate. PROCEDURE: An ultrasound guided thoracentesis was thoroughly discussed with the patient and questions answered. The benefits, risks, alternatives and complications were also discussed. The patient understands and wishes to proceed with the procedure. Written consent was obtained. Ultrasound was performed to localize and mark an adequate pocket of fluid in the left chest. The area was then prepped and draped in the normal sterile fashion. 1% Lidocaine was used for local anesthesia. Under ultrasound guidance a Safe-T-Centesis catheter was introduced. Thoracentesis was performed. The catheter was removed and a dressing applied. FINDINGS: A total of approximately 1 L of bloody pleural fluid was removed. Samples were sent to the laboratory as requested by the clinical team. IMPRESSION: Successful ultrasound guided left thoracentesis yielding 1 L of pleural fluid. Read by: Ascencion Dike PA-C Electronically Signed   By: Aletta Edouard M.D.   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  No improvement and patient transitioned to comfort measures.  Discussed with wife  and son at bedside.  Will change morphine to scheduled dose.  Add Robinul.  Patient unstable for transfer to hospice home.  We will continue comfort measures in the hospital.  All the records are reviewed and case discussed with Care Management/Social Worker Management plans discussed with the patient, family and they are in agreement.  CODE STATUS: DNR  DVT Prophylaxis: SCDs  TOTAL TIME TAKING CARE OF THIS PATIENT: 25 minutes. Greater than 50% of time spent in discussing with family and coordinating care  POSSIBLE D/C IN 1-2 DAYS, DEPENDING ON CLINICAL CONDITION.  Leia Alf Dorreen Valiente M.D on 06/18/2017 at 10:14 AM  Between 7am to 6pm - Pager - [phone]  After 6pm go to [email or web address] - password [email or web address] [facility]  Office  [phone]  CC: Primary care physician; [name], MD  Note: This dictation was prepared with Dragon dictation along with smaller phrase technology. Any transcriptional errors that result from this process are unintentional.

## 2017-06-19 LAB — CYTOLOGY - NON PAP

## 2017-06-19 LAB — PH, BODY FLUID: pH, Body Fluid: 7.6

## 2017-06-20 LAB — CULTURE, BLOOD (ROUTINE X 2)
CULTURE: NO GROWTH
Culture: NO GROWTH
SPECIAL REQUESTS: ADEQUATE
SPECIAL REQUESTS: ADEQUATE

## 2017-06-20 LAB — BODY FLUID CULTURE: CULTURE: NO GROWTH

## 2017-06-25 NOTE — Progress Notes (Signed)
Chaplain received a page for pt. who had passed in [id number]. CH met the family of the deceased at the bedside. Daughter stated that pt. has been sick for a while and that this was expected. Family requested for prayer to cope with the loss, which CH offered, comforted the family with a ministry of presence. CH is available as needed.    06/17/2017 0900  Clinical Encounter Type  Visited With Patient;Patient and family together  Visit Type Follow-up;Spiritual support;Death;Other (Comment)  Referral From Nurse  Spiritual Encounters  Spiritual Needs Prayer;Grief support   

## 2017-06-25 NOTE — Progress Notes (Signed)
Pt passed this morning with family at bedside, MD notified, CDS notified, and Chaplain called to bedside. Pt family expressed this to be expected, and pt. To be sent to funeral home from bedside following preparation.

## 2017-06-25 DEATH — deceased

## 2017-07-16 LAB — FUNGUS CULTURE WITH STAIN

## 2017-07-16 LAB — FUNGAL ORGANISM REFLEX

## 2017-07-16 LAB — FUNGUS CULTURE RESULT

## 2017-07-26 NOTE — Death Summary Note (Signed)
DEATH SUMMARY   Patient Details  Name: Charles Gallagher MRN: [id number] DOB: Aug 25, 1928  Admission/Discharge Information   Admit Date:  2017/06/30  Date of Death: Date of Death: 07-04-17  Time of Death: Time of Death: 0734  Length of Stay: 4  Referring Physician: [name], MD   Reason(s) for Hospitalization  Pneumonia  Diagnoses  Preliminary cause of death:  Secondary Diagnoses (including complications and co-morbidities):  Active Problems:   HCAP (healthcare-associated pneumonia)   Pressure injury of skin  *Acute on chronic hypoxic respiratory failure *Large left pleural effusion likely malignant *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 year old male with dementia, COPD, CHF admitted due to HCAP, acute on chronic resp failure and weakness.  Patient was treated aggressively with IV abx, Lasix, left thoracentesis. Pulmonology consulted.  Inspite of aggressive care with thoracentesis, chest PT, Mucomyst nebs and IV abx patient continued to deteriorate and family transitioned the patient to comfort measures for end of life care. Plan was to transfer patient to hospice home but passed away prior to that.    Pertinent Labs and Studies  Significant Diagnostic Studies Ct Chest W Contrast  Result Date: 06/16/2017 CLINICAL DATA:  Charles Gallagher out of left chest by chest x-ray. Recent pneumonia. EXAM: CT CHEST WITH CONTRAST TECHNIQUE: Multidetector CT imaging of the chest was performed during intravenous contrast administration. CONTRAST:  32mL ISOVUE-300 IOPAMIDOL (ISOVUE-300) INJECTION 61% COMPARISON:  Chest x-ray on 06-30-2017 FINDINGS: Cardiovascular: The heart size is within normal  limits. The thoracic aorta is of normal caliber and demonstrates calcified plaque. There is evidence of prior CABG. No pericardial fluid. Central pulmonary arteries are normal in caliber. Mediastinum/Nodes: No evidence of mediastinal masses. Borderline to mildly enlarged mediastinal lymph nodes present with right lower paratracheal lymph node measuring 1.3 cm and subcarinal lymph node measuring 1.5 cm. Mildly prominent right hilar lymph node measures 1.1 cm. Lungs/Pleura: There is a large left pleural effusion and complete collapse and atelectasis of the left lung with no aerated lung present. The left mainstem bronchus is occluded by material representing either mucous or tumor. No obvious pleural masses or evidence pleural enhancement/thickening to suggest empyema. Pleural fluid density is low. There is some calcification in the anterior and lateral left lung. There is some emphysematous disease in the upper right lung. Opacity in the posterior right upper lobe may be chronic as it appears to be associated with some mild bronchiectasis. There is some scattered scarring throughout the right lung. Small right lower lobe nodule towards the posterior lung base measures 0.8 cm. Upper Abdomen: Calcified granuloma present in the spleen. No adrenal masses. Musculoskeletal: No chest wall abnormality. No acute or significant osseous findings. IMPRESSION: 1. Complete atelectasis of the left lung with occlusion of the left mainstem bronchus by either mucous or tumor. Pulmonology consultation recommended. 2. Large left pleural effusion. No pleural masses or evidence of pleural enhancement. 3. Mildly enlarged right lower paratracheal and subcarinal lymph nodes. Neoplastic process cannot be excluded. There also is a mildly prominent right hilar lymph node. 4. Chronic emphysematous lung disease in the right lung with some focal bronchiectasis in the posterior right upper lobe. 5. 8 mm nodule at the posterior right lung base.  Emphysema (ICD10-J43.9). Electronically Signed   By: Eulas Post  Kathlene Cote M.D.   On: 06/16/2017 09:25   Dg Chest Port 1 View  Result Date: 06/16/2017 CLINICAL DATA:  Status post left thoracentesis EXAM: PORTABLE CHEST 1 VIEW COMPARISON:  06/05/2017 FINDINGS: Large left pleural effusion. No pneumothorax following left thoracentesis. Near complete opacification of the left hemithorax with only a small amount of aerated left lung. Right lung is clear. IMPRESSION: No pneumothorax following left thoracentesis. Near complete opacification of the left hemithorax. Electronically Signed   By: Rolm Baptise M.D.   On: 06/16/2017 14:20   Dg Chest Port 1 View  Result Date: 06/20/2017 CLINICAL DATA:  Recently diagnosed with pneumonia and placed on Levaquin since 06/05/2017. Tachypnea. EXAM: PORTABLE CHEST 1 VIEW COMPARISON:  09/24/2013 FINDINGS: Sternotomy wires unchanged. Examination demonstrates complete opacification of the left hemithorax likely combination of pleural fluid and atelectasis. Prominence right hilar density. Right lung otherwise clear. Cardiac silhouette is mostly obscured. Remaining bones and soft tissues unchanged. IMPRESSION: Complete opacification left hemithorax likely combination of pleural fluid and atelectasis. Prominence soft tissues density of the right hilum. Recommend contrast-enhanced chest CT to evaluate for underlying mass and adenopathy. Electronically Signed   By: Marin Olp M.D.   On: 06/09/2017 17:56   US Thoracentesis Asp Pleural Space W/img Guide  Result Date: 06/16/2017 INDICATION: Shortness of breath. Large left pleural effusion. Request for diagnostic and therapeutic thoracentesis. EXAM: ULTRASOUND GUIDED LEFT THORACENTESIS MEDICATIONS: None. COMPLICATIONS: None immediate. PROCEDURE: An ultrasound guided thoracentesis was thoroughly discussed with the patient and questions answered. The benefits, risks, alternatives and complications were also discussed. The patient  understands and wishes to proceed with the procedure. Written consent was obtained. Ultrasound was performed to localize and mark an adequate pocket of fluid in the left chest. The area was then prepped and draped in the normal sterile fashion. 1% Lidocaine was used for local anesthesia. Under ultrasound guidance a Safe-T-Centesis catheter was introduced. Thoracentesis was performed. The catheter was removed and a dressing applied. FINDINGS: A total of approximately 1 L of bloody pleural fluid was removed. Samples were sent to the laboratory as requested by the clinical team. IMPRESSION: Successful ultrasound guided left thoracentesis yielding 1 L of pleural fluid. Read by: Ascencion Dike PA-C Electronically Signed   By: Aletta Edouard M.D.   On: 06/16/2017 14:21    Microbiology No results found for this or any previous visit (from the past 240 hour(s)).  Lab Basic Metabolic Panel: No results for input(s): NA, K, CL, CO2, GLUCOSE, BUN, CREATININE, CALCIUM, MG, PHOS in the last 168 hours. Liver Function Tests: No results for input(s): AST, ALT, ALKPHOS, BILITOT, PROT, ALBUMIN in the last 168 hours. No results for input(s): LIPASE, AMYLASE in the last 168 hours. No results for input(s): AMMONIA in the last 168 hours. CBC: No results for input(s): WBC, NEUTROABS, HGB, HCT, MCV, PLT in the last 168 hours. Cardiac Enzymes: No results for input(s): CKTOTAL, CKMB, CKMBINDEX, TROPONINI in the last 168 hours. Sepsis Labs: No results for input(s): PROCALCITON, WBC, LATICACIDVEN in the last 168 hours.  Procedures/Operations  Left thoracentesis   Chace Bisch R Kemar Pandit 07/01/2017, 4:22 PM

## 2017-08-23 NOTE — Progress Notes (Addendum)
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    No tissue biopsy was done due to patient's acute deterioration and poor prognosis but this diagnosis seems most likely.

## 2017-09-18 IMAGING — DX DG KNEE 1-2V*R*
2 series · 2 of 2 positions shown · non-contrast
Comparison: None.

CLINICAL DATA: Fall with right knee pain.

EXAM:
RIGHT KNEE - 1-2 VIEW

[knee ap]
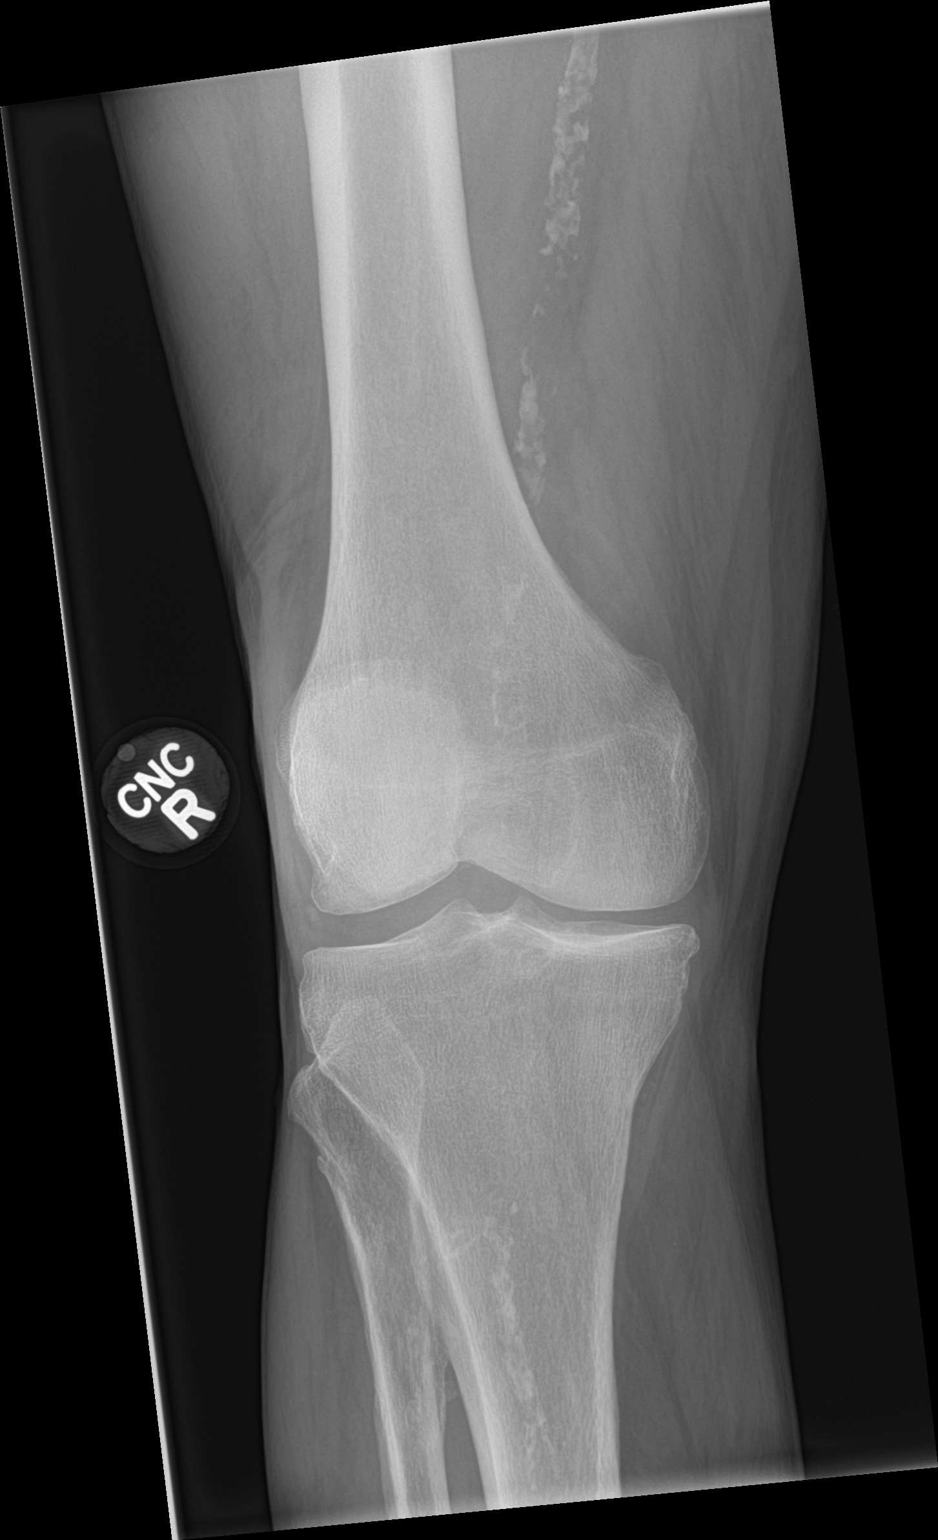

[knee lat]
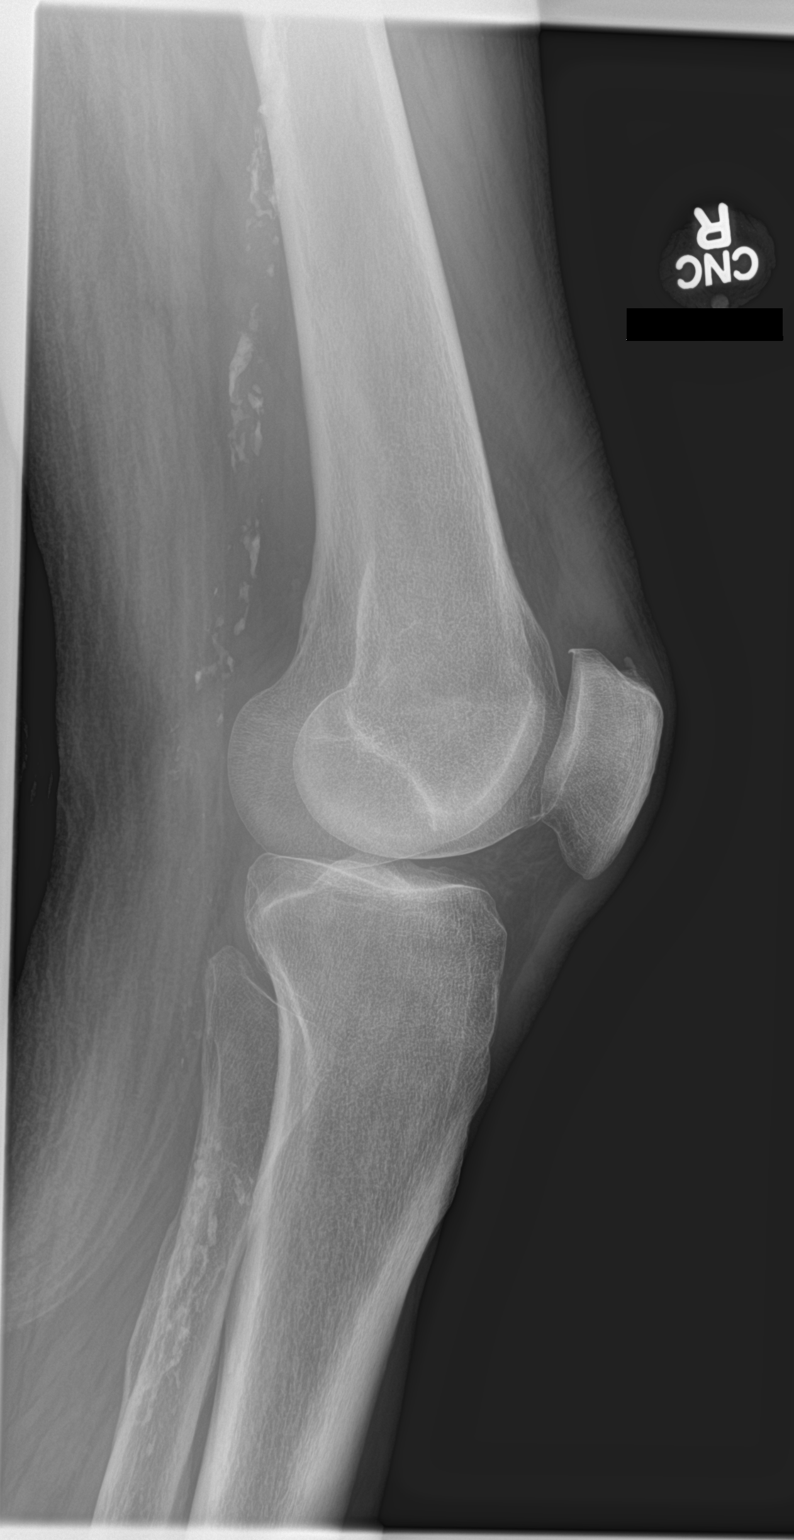

[2 of 2 positions shown; findings below may reference images not displayed]

FINDINGS: No evidence of fracture, dislocation, or joint effusion. There is
mild medial joint space narrowing. Atherosclerotic calcifications
seen involving the distal SFA and popliteal artery. Soft tissues are
unremarkable.
IMPRESSION: No acute findings.

## 2019-01-06 IMAGING — CT CT CHEST W/ CM
2 of 3 series · 15 of 36 positions shown, 18 images · IV contrast (iopamidol)
Comparison: Chest x-ray on 06/15/2017

CLINICAL DATA: White out of left chest by chest x-ray. Recent
pneumonia.

EXAM:
CT CHEST WITH CONTRAST
TECHNIQUE: Multidetector CT imaging of the chest was performed during
intravenous contrast administration.
CONTRAST:  60mL AR49Q9-HHH IOPAMIDOL (AR49Q9-HHH) INJECTION 61%

[Series 2: axial st · axial · 0.78mm/px · z∈[-358,-84]mm · 12 of 161 slices shown, 15 images]
[im 12/161  mediastinal]
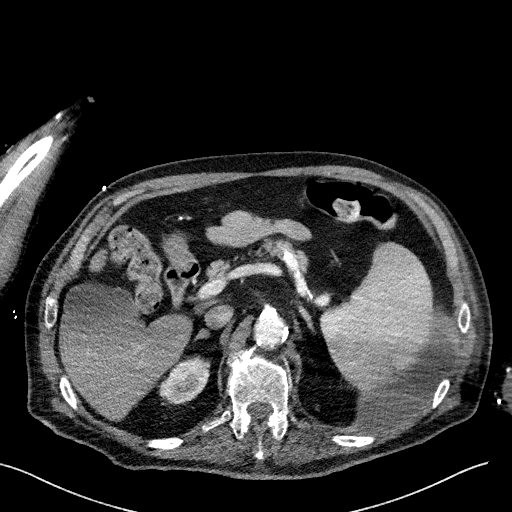
[im 12/161  lung]
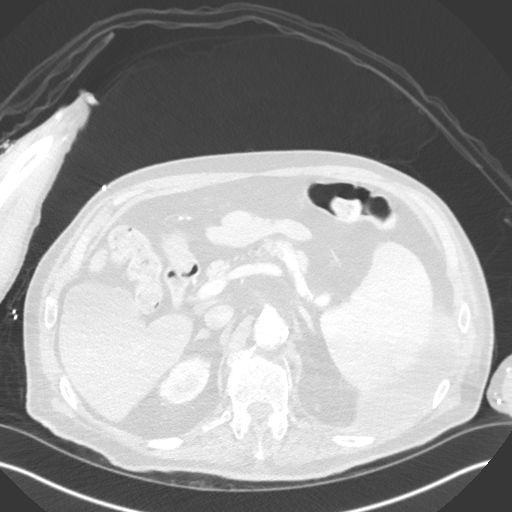
[im 24/161  lung]
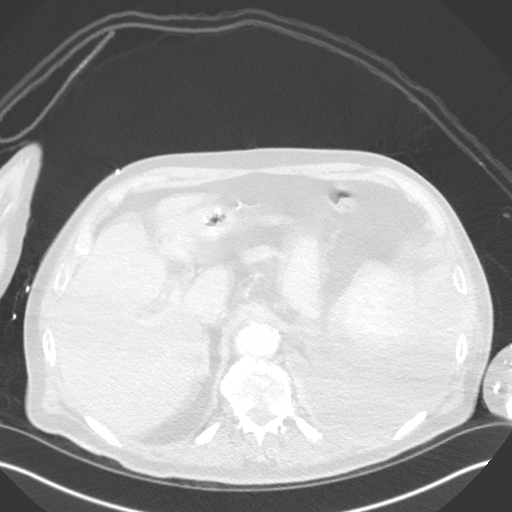
[im 36/161  lung]
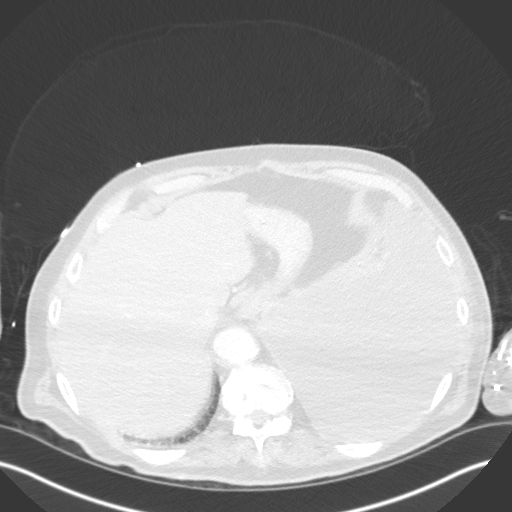
[im 48/161  lung]
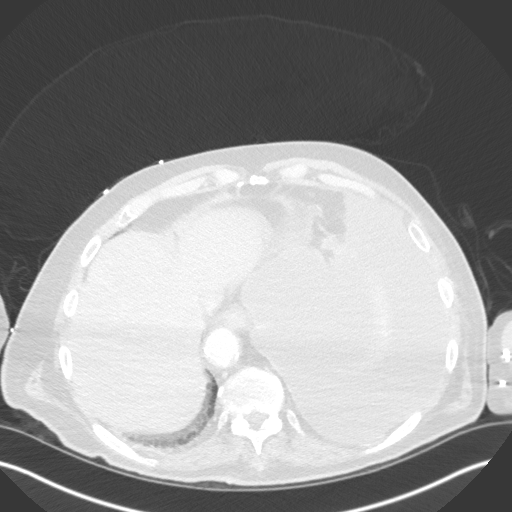
[im 60/161  mediastinal]
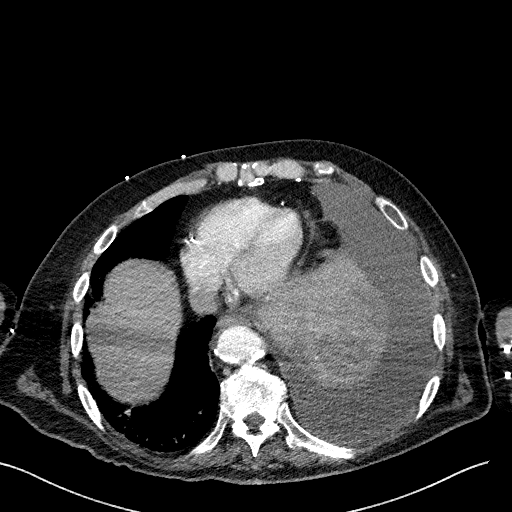
[im 60/161  lung]
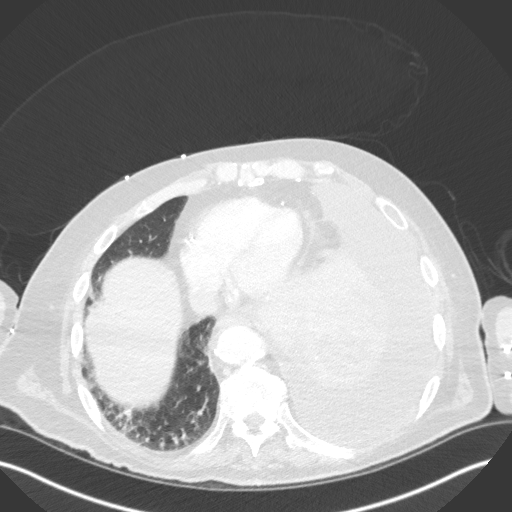
[im 72/161  lung]
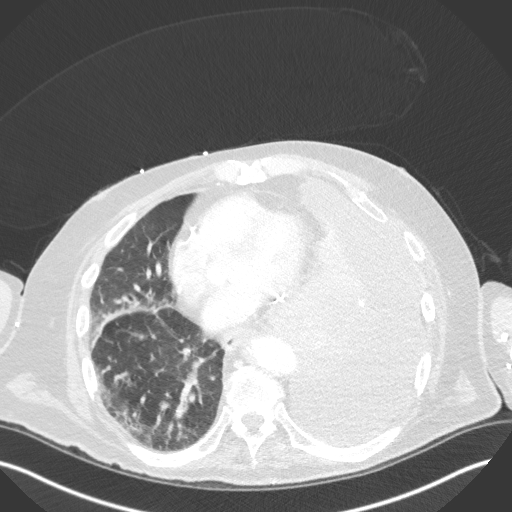
[im 89/161  lung]
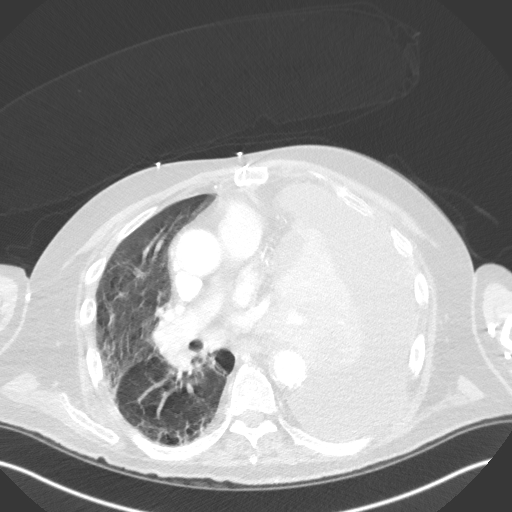
[im 101/161  lung]
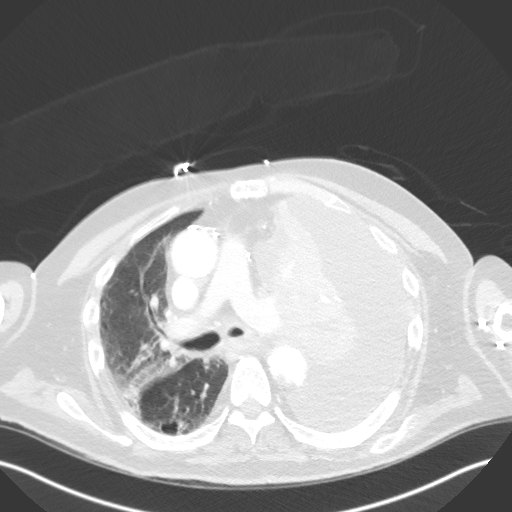
[im 113/161  mediastinal]
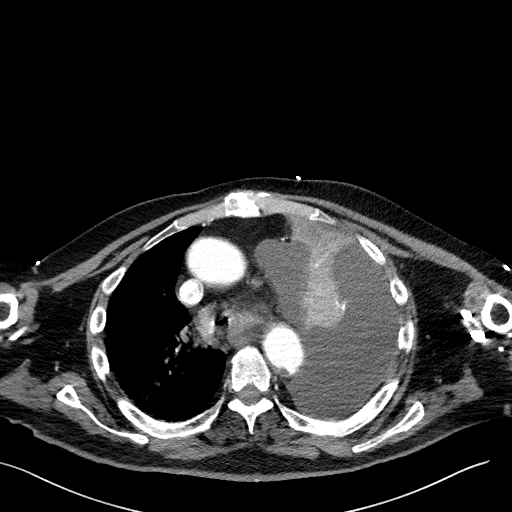
[im 113/161  lung]
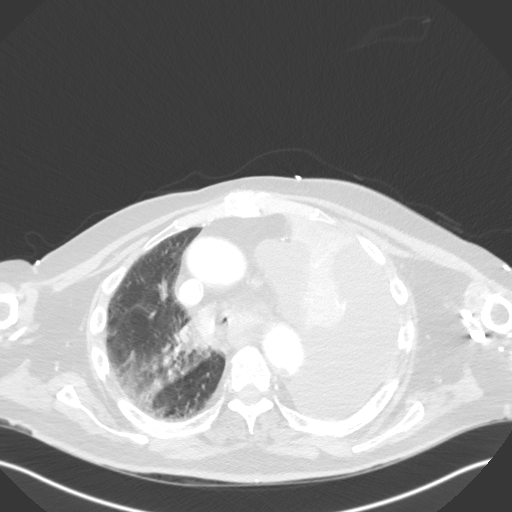
[im 125/161  lung]
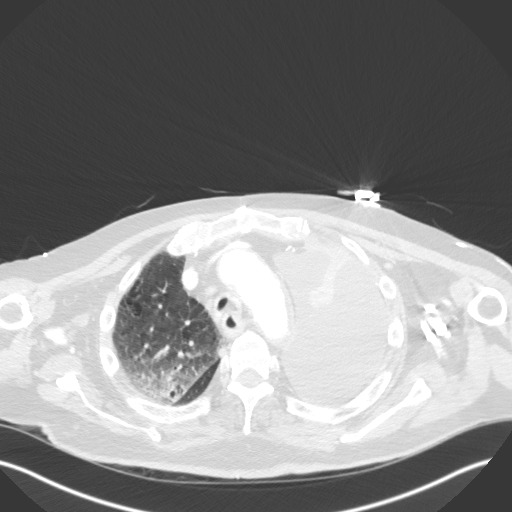
[im 137/161  lung]
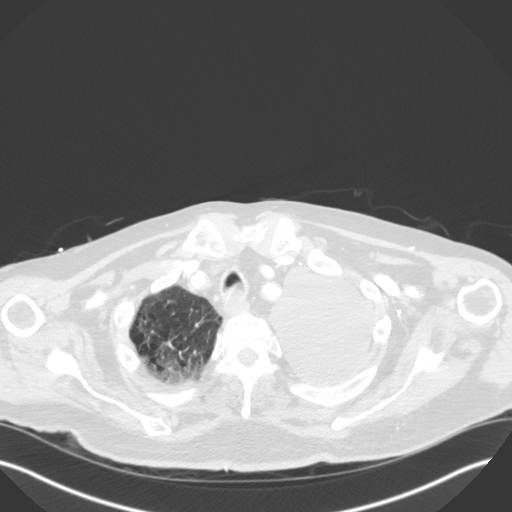
[im 149/161  lung]
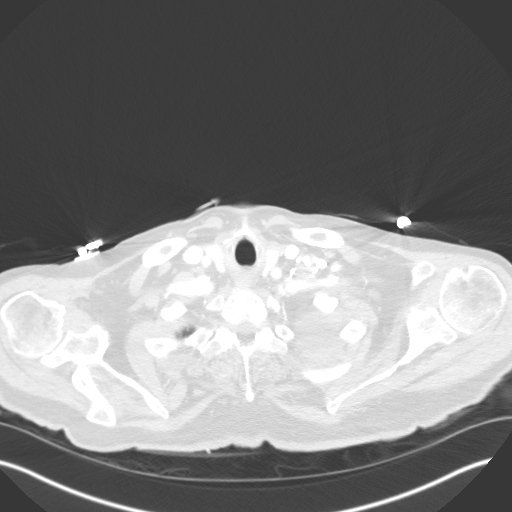

[Series 5: coronal · coronal · 0.67mm/px · 3 of 135 slices shown]
[im 27/135  lung]
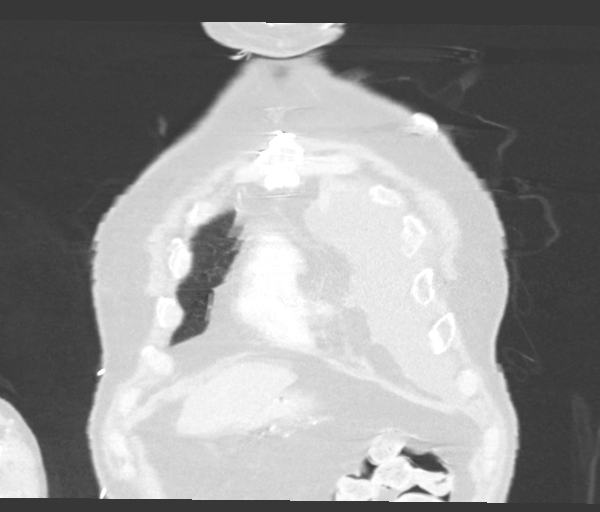
[im 54/135  lung]
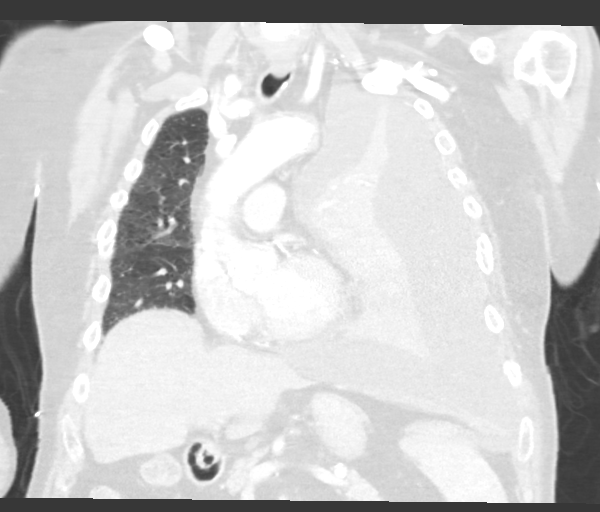
[im 81/135  lung]
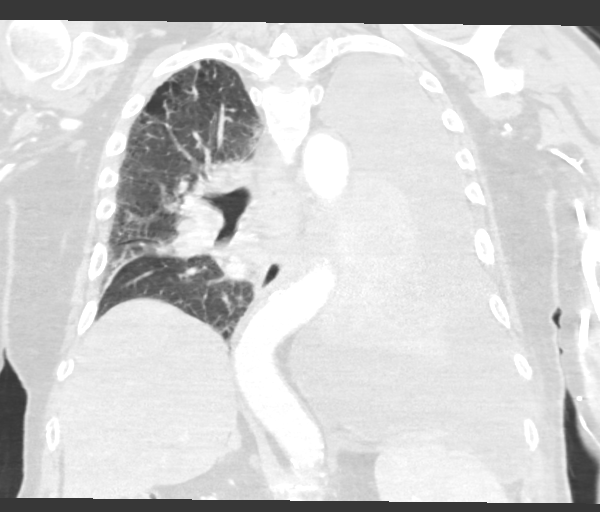

[15 of 36 positions shown; findings below may reference images not displayed]

FINDINGS: Cardiovascular: The heart size is within normal limits. The thoracic
aorta is of normal caliber and demonstrates calcified plaque. There
is evidence of prior CABG. No pericardial fluid. Central pulmonary
arteries are normal in caliber.

Mediastinum/Nodes: No evidence of mediastinal masses. Borderline to
mildly enlarged mediastinal lymph nodes present with right lower
paratracheal lymph node measuring 1.3 cm and subcarinal lymph node
measuring 1.5 cm. Mildly prominent right hilar lymph node measures
1.1 cm.

Lungs/Pleura: There is a large left pleural effusion and complete
collapse and atelectasis of the left lung with no aerated lung
present. The left mainstem bronchus is occluded by material
representing either mucous or tumor. No obvious pleural masses or
evidence pleural enhancement/thickening to suggest empyema. Pleural
fluid density is low.

There is some calcification in the anterior and lateral left lung.
There is some emphysematous disease in the upper right lung. Opacity
in the posterior right upper lobe may be chronic as it appears to be
associated with some mild bronchiectasis. There is some scattered
scarring throughout the right lung. Small right lower lobe nodule
towards the posterior lung base measures 0.8 cm.

Upper Abdomen: Calcified granuloma present in the spleen. No adrenal
masses.

Musculoskeletal: No chest wall abnormality. No acute or significant
osseous findings.
IMPRESSION: 1. Complete atelectasis of the left lung with occlusion of the left
mainstem bronchus by either mucous or tumor. Pulmonology
consultation recommended.
2. Large left pleural effusion. No pleural masses or evidence of
pleural enhancement.
3. Mildly enlarged right lower paratracheal and subcarinal lymph
nodes. Neoplastic process cannot be excluded. There also is a mildly
prominent right hilar lymph node.
4. Chronic emphysematous lung disease in the right lung with some
focal bronchiectasis in the posterior right upper lobe.
5. 8 mm nodule at the posterior right lung base.

Emphysema ([facility]-ZV7.O).
